# Patient Record
Sex: Female | Born: 1975 | Race: White | Hispanic: No | Marital: Married | Smoking: Never smoker
Health system: Southern US, Community
[De-identification: ages and names within clinical notes are randomized; demographics above are authoritative.]

## PROBLEM LIST (undated history)

## (undated) DIAGNOSIS — M1990 Unspecified osteoarthritis, unspecified site: Secondary | ICD-10-CM

## (undated) DIAGNOSIS — R51 Headache: Secondary | ICD-10-CM

## (undated) HISTORY — DX: Headache: R51

## (undated) HISTORY — DX: Unspecified osteoarthritis, unspecified site: M19.90

---

## 2017-05-12 DIAGNOSIS — R519 Headache, unspecified: Secondary | ICD-10-CM

## 2017-05-12 HISTORY — DX: Headache, unspecified: R51.9

## 2017-10-22 ENCOUNTER — Telehealth: Payer: Self-pay | Admitting: Obstetrics & Gynecology

## 2017-10-22 NOTE — Telephone Encounter (Signed)
[facility]  For [facility] OB [facility]. Called and left voicemail for patient to call back to be schedule

## 2017-10-23 NOTE — Telephone Encounter (Signed)
Patient is schedule 11/03/17

## 2017-10-29 ENCOUNTER — Encounter: Payer: Self-pay | Admitting: Advanced Practice Midwife

## 2017-11-03 ENCOUNTER — Encounter: Payer: Self-pay | Admitting: Advanced Practice Midwife

## 2017-11-03 ENCOUNTER — Ambulatory Visit (INDEPENDENT_AMBULATORY_CARE_PROVIDER_SITE_OTHER): Payer: BLUE CROSS/BLUE SHIELD | Admitting: Advanced Practice Midwife

## 2017-11-03 VITALS — BP 100/60 | Ht 66.0 in | Wt 157.0 lb

## 2017-11-03 DIAGNOSIS — Z130 Encounter for screening for diseases of the blood and blood-forming organs and certain disorders involving the immune mechanism: Secondary | ICD-10-CM

## 2017-11-03 DIAGNOSIS — Z131 Encounter for screening for diabetes mellitus: Secondary | ICD-10-CM

## 2017-11-03 DIAGNOSIS — O09522 Supervision of elderly multigravida, second trimester: Secondary | ICD-10-CM

## 2017-11-03 DIAGNOSIS — O9989 Other specified diseases and conditions complicating pregnancy, childbirth and the puerperium: Secondary | ICD-10-CM

## 2017-11-03 DIAGNOSIS — Z98891 History of uterine scar from previous surgery: Secondary | ICD-10-CM

## 2017-11-03 DIAGNOSIS — O09292 Supervision of pregnancy with other poor reproductive or obstetric history, second trimester: Secondary | ICD-10-CM

## 2017-11-03 DIAGNOSIS — Z113 Encounter for screening for infections with a predominantly sexual mode of transmission: Secondary | ICD-10-CM

## 2017-11-03 DIAGNOSIS — O34219 Maternal care for unspecified type scar from previous cesarean delivery: Secondary | ICD-10-CM

## 2017-11-03 DIAGNOSIS — Z3A25 25 weeks gestation of pregnancy: Secondary | ICD-10-CM

## 2017-11-03 DIAGNOSIS — N644 Mastodynia: Secondary | ICD-10-CM

## 2017-11-03 DIAGNOSIS — R5383 Other fatigue: Secondary | ICD-10-CM

## 2017-11-03 DIAGNOSIS — O0990 Supervision of high risk pregnancy, unspecified, unspecified trimester: Secondary | ICD-10-CM

## 2017-11-03 NOTE — Progress Notes (Signed)
C/O was seeing a specialist d/t age.

## 2017-11-03 NOTE — Patient Instructions (Signed)
Vaginal Birth After Cesarean Delivery Vaginal birth after cesarean delivery (VBAC) is giving birth vaginally after previously delivering a baby by a cesarean. In the past, if a woman had a cesarean delivery, all births afterward would be done by cesarean delivery. This is no longer true. It can be safe for the mother to try a vaginal delivery after having a cesarean delivery. It is important to discuss VBAC with your health care provider early in the pregnancy so you can understand the risks, benefits, and options. It will give you time to decide what is best in your particular case. The final decision about whether to have a VBAC or repeat cesarean delivery should be between you and your health care provider. Any changes in your health or your baby's health during your pregnancy may make it necessary to change your initial decision about VBAC. Women who plan to have a VBAC should check with their health care provider to be sure that:  The previous cesarean delivery was done with a low transverse uterine cut (incision) (not a vertical classical incision).  The birth canal is big enough for the baby.  There were no other operations on the uterus.  An electronic fetal monitor (EFM) will be on at all times during labor.  An operating room will be available and ready in case an emergency cesarean delivery is needed.  A health care provider and surgical nursing staff will be available at all times during labor to be ready to do an emergency delivery cesarean if necessary.  An anesthesiologist will be present in case an emergency cesarean delivery is needed.  The nursery is prepared and has adequate personnel and necessary equipment available to care for the baby in case of an emergency cesarean delivery. Benefits of VBAC  Shorter stay in the hospital.  Avoidance of risks associated with cesarean delivery, such as: ? Surgical complications, such as opening of the incision or hernia in the  incision. ? Injury to other organs. ? Fever. This can occur if an infection develops after surgery. It can also occur as a reaction to the medicine given to make you numb during the surgery.  Less blood loss and need for blood transfusions.  Lower risk of blood clots and infection.  Shorter recovery.  Decreased risk for having to remove the uterus (hysterectomy).  Decreased risk for the placenta to completely or partially cover the opening of the uterus (placenta previa) with a future pregnancy.  Decrease risk in future labor and delivery. Risks of a VBAC  Tearing (rupture) of the uterus. This is occurs in less than 1% of VBACs. The risk of this happening is higher if: ? Steps are taken to begin the labor process (induce labor) or stimulate or strengthen contractions (augment labor). ? Medicine is used to soften (ripen) the cervix.  Having to remove the uterus (hysterectomy) if it ruptures. VBAC should not be done if:  The previous cesarean delivery was done with a vertical (classical) or T-shaped incision or you do not know what kind of incision was made.  You had a ruptured uterus.  You have had certain types of surgery on your uterus, such as removal of uterine fibroids. Ask your health care provider about other types of surgeries that prevent you from having a VBAC.  You have certain medical or childbirth (obstetrical) problems.  There are problems with the baby.  You have had two previous cesarean deliveries and no vaginal deliveries. Other facts to know about VBAC:  It   is safe to have an epidural anesthetic with VBAC.  It is safe to turn the baby from a breech position (attempt an external cephalic version).  It is safe to try a VBAC with twins.  VBAC may not be successful if your baby weights 8.8 lb (4 kg) or more. However, weight predictions are not always accurate and should not be used alone to decide if VBAC is right for you.  There is an increased failure rate  if the time between the cesarean delivery and VBAC is less than 19 months.  Your health care provider may advise against a VBAC if you have preeclampsia (high blood pressure, protein in the urine, and swelling of face and extremities).  VBAC is often successful if you previously gave birth vaginally.  VBAC is often successful when the labor starts spontaneously before the due date.  Delivering a baby through a VBAC is similar to having a normal spontaneous vaginal delivery. This information is not intended to replace advice given to you by your health care provider. Make sure you discuss any questions you have with your health care provider. Document Released: 10/19/2006 Document Revised: 10/04/2015 Document Reviewed: 11/25/2012 Elsevier Interactive Patient Education  2018 Elsevier Inc.  

## 2017-11-04 ENCOUNTER — Encounter: Payer: Self-pay | Admitting: Advanced Practice Midwife

## 2017-11-04 DIAGNOSIS — O0990 Supervision of high risk pregnancy, unspecified, unspecified trimester: Secondary | ICD-10-CM | POA: Insufficient documentation

## 2017-11-04 DIAGNOSIS — Z98891 History of uterine scar from previous surgery: Secondary | ICD-10-CM | POA: Insufficient documentation

## 2017-11-04 NOTE — Progress Notes (Signed)
New Obstetric Patient H&P    Chief Complaint: Transfer of care from [facility], [place], [place]   History of Present Illness: Patient is a 42 y.o. G28P1011 Race unavailable female, presents with amenorrhea and positive home pregnancy test. Patient's last menstrual period was 05/10/2017 (exact date). and based on her  LMP, her EDD is Estimated Date of Delivery: 02/14/18 and her EGA is [redacted]w[redacted]d. Cycles are 6. days, regular, and occur approximately every : 31 days. Her last pap smear was 9 months ago and was no abnormalities.    She had a urine pregnancy test which was positive 5 month(s)  ago. Her last menstrual period was normal and lasted for  6 day(s). Since her LMP she claims she has experienced breast tenderness, fatigue, nausea. She denies vaginal bleeding. Her past medical history is noncontributory. Her prior pregnancies are notable for SAB, Primary LTCS for breech in 2015. She is AMA and will be 42 years old at time of delivery. She is undecided about repeat c-section vs VBAC.  Since her LMP, she admits to the use of tobacco products  no She claims she has gained   12 pounds since the start of her pregnancy.  There are cats in the home in the home  no  She admits close contact with children on a regular basis  yes  She has had chicken pox in the past no She has had Tuberculosis exposures, symptoms, or previously tested positive for TB   no Current or past history of domestic violence. no  Genetic Screening/Teratology Counseling: (Includes patient, baby's father, or anyone in either family with:)   1. Patient's age >/= 47 at [facility]  yes 2. Thalassemia ([place], [place], Mediterranean, or Asian background): MCV<80  no 3. Neural tube defect (meningomyelocele, spina bifida, anencephaly)  no 4. Congenital heart defect  no  5. Down syndrome  no 6. Tay-Sachs (Jewish, [place]  no 7. Canavan's Disease  no 8. Sickle cell disease or trait (African)  no  9. Hemophilia or other blood  disorders  no  10. Muscular dystrophy  no  11. Cystic fibrosis  no  12. Huntington's Chorea  no  13. Mental retardation/autism  no 14. Other inherited genetic or chromosomal disorder  no 15. Maternal metabolic disorder (DM, PKU, etc)  no 16. Patient or FOB with a child with a birth defect not listed above no  16a. Patient or FOB with a birth defect themselves no 17. Recurrent pregnancy loss, or stillbirth  no  18. Any medications since LMP other than prenatal vitamins (include vitamins, supplements, OTC meds, drugs, alcohol)  no 19. Any other genetic/environmental exposure to discuss  no  Infection History:   1. Lives with someone with TB or TB exposed  no  2. Patient or partner has history of genital herpes  no 3. Rash or viral illness since LMP  no 4. History of STI (GC, CT, HPV, syphilis, HIV)  no 5. History of recent travel :  no  Other pertinent information:  no     Review of Systems:10 point review of systems negative unless otherwise noted in HPI  Past Medical History:  Past Medical History:  Diagnosis Date  . Frequent headaches 2019   DURING PREG    Past Surgical History:  Past Surgical History:  Procedure Laterality Date  . CESAREAN SECTION  01/31/2014   BREECH    Gynecologic History: Patient's last menstrual period was 05/10/2017 (exact date).  Obstetric History: [id number]  Family History:  Family History  Problem Relation Age of Onset  . Diabetes Father   . Heart Problems Father   . Hypertension Father   . Heart Problems Paternal Grandmother   . Hypertension Paternal Grandmother   . Heart Problems Paternal Grandfather   . Hypertension Paternal Grandfather   . Heart Problems Paternal Uncle   . Heart Problems Paternal Uncle   . Heart Problems Paternal Uncle     Social History:  Social History   Socioeconomic History  . Marital status: Married    Spouse name: Not on file  . Number of children: 1  . Years of education: 45  . Highest education  level: Not on file  Occupational History  . Occupation: UNEMPLOYED  Social Needs  . Financial resource strain: Not on file  . Food insecurity:    Worry: Not on file    Inability: Not on file  . Transportation needs:    Medical: Not on file    Non-medical: Not on file  Tobacco Use  . Smoking status: Never Smoker  . Smokeless tobacco: Never Used  Substance and Sexual Activity  . Alcohol use: Not Currently  . Drug use: Never  . Sexual activity: Yes    Birth control/protection: None  Lifestyle  . Physical activity:    Days per week: Not on file    Minutes per session: Not on file  . Stress: Not on file  Relationships  . Social connections:    Talks on phone: Not on file    Gets together: Not on file    Attends religious service: Not on file    Active member of club or organization: Not on file    Attends meetings of clubs or organizations: Not on file    Relationship status: Not on file  . Intimate partner violence:    Fear of current or ex partner: Not on file    Emotionally abused: Not on file    Physically abused: Not on file    Forced sexual activity: Not on file  Other Topics Concern  . Not on file  Social History Narrative  . Not on file    Allergies:  No Known Allergies  Medications: Prior to Admission medications   Medication Sig Start Date End Date Taking? Authorizing Provider  Magnesium 500 MG TABS Take 1 tablet by mouth daily.   Yes [provider]  Prenatal Vit-Fe Fumarate-FA (MULTIVITAMIN-PRENATAL) 27-0.8 MG TABS tablet Take 1 tablet by mouth daily at 12 noon.   Yes [provider]    Physical Exam Vitals: Blood pressure 100/60, height 5\' 6"  (1.676 m), weight 157 lb (71.2 kg), last menstrual period 05/10/2017.  General: NAD HEENT: normocephalic, anicteric Thyroid: no enlargement, no palpable nodules Pulmonary: No increased work of breathing, CTAB Cardiovascular: RRR, distal pulses 2+ Abdomen: NABS, soft, non-tender,  non-distended.  Umbilicus without lesions.  No hepatomegaly, splenomegaly or masses palpable. No evidence of hernia  Genitourinary: deferred for recent PAP/no concerns Extremities: no edema, erythema, or tenderness Neurologic: Grossly intact Psychiatric: mood appropriate, affect full   Assessment: 42 y.o. G3P1011 at [redacted]w[redacted]d presenting for transfer of care  Plan: 1) Avoid alcoholic beverages. 2) Patient encouraged not to smoke.  3) Discontinue the use of all non-medicinal drugs and chemicals.  4) Take prenatal vitamins daily.  5) Nutrition, food safety (fish, cheese advisories, and high nitrite foods) and exercise discussed. 6) Hospital and practice style discussed with cross coverage system.  7) Genetic Screening, such as with 1st Trimester Screening, cell  free fetal DNA, AFP testing, and Ultrasound, as well as with amniocentesis and CVS as appropriate, was discussed with patient earlier in the pregnancy. Patient declined genetic testing 8) Patient is asked about travel to areas at risk for the [place] virus, and counseled to avoid travel and exposure to mosquitoes or sexual partners who may have themselves been exposed to the virus. Testing is discussed, and will be ordered as appropriate.  9) ROB in 3 weeks for 28 week, 1 hour gtt and Rhogam injection   Tresea MallJane Canton Yearby, CNM [facility], [facility] 11/04/2017, 2:16 PM

## 2017-11-24 ENCOUNTER — Ambulatory Visit (INDEPENDENT_AMBULATORY_CARE_PROVIDER_SITE_OTHER): Payer: BLUE CROSS/BLUE SHIELD | Admitting: Obstetrics and Gynecology

## 2017-11-24 ENCOUNTER — Other Ambulatory Visit: Payer: BLUE CROSS/BLUE SHIELD

## 2017-11-24 VITALS — BP 104/58 | Wt 163.0 lb

## 2017-11-24 DIAGNOSIS — Z98891 History of uterine scar from previous surgery: Secondary | ICD-10-CM

## 2017-11-24 DIAGNOSIS — Z130 Encounter for screening for diseases of the blood and blood-forming organs and certain disorders involving the immune mechanism: Secondary | ICD-10-CM

## 2017-11-24 DIAGNOSIS — O0990 Supervision of high risk pregnancy, unspecified, unspecified trimester: Secondary | ICD-10-CM

## 2017-11-24 DIAGNOSIS — O26899 Other specified pregnancy related conditions, unspecified trimester: Secondary | ICD-10-CM

## 2017-11-24 DIAGNOSIS — Z6791 Unspecified blood type, Rh negative: Secondary | ICD-10-CM

## 2017-11-24 DIAGNOSIS — O09523 Supervision of elderly multigravida, third trimester: Secondary | ICD-10-CM | POA: Insufficient documentation

## 2017-11-24 DIAGNOSIS — Z3A28 28 weeks gestation of pregnancy: Secondary | ICD-10-CM

## 2017-11-24 DIAGNOSIS — Z131 Encounter for screening for diabetes mellitus: Secondary | ICD-10-CM

## 2017-11-24 DIAGNOSIS — Z113 Encounter for screening for infections with a predominantly sexual mode of transmission: Secondary | ICD-10-CM

## 2017-11-24 MED ORDER — RHO D IMMUNE GLOBULIN 1500 UNIT/2ML IJ SOSY
300.0000 ug | PREFILLED_SYRINGE | Freq: Once | INTRAMUSCULAR | Status: AC
  Administered 2017-11-24: 300 ug via INTRAMUSCULAR

## 2017-11-24 NOTE — Progress Notes (Signed)
Routine Prenatal Care Visit  Subjective  Erin Rangel is a 42 y.o. G3P1011 at 6776w2d being seen today for ongoing prenatal care.  She is currently monitored for the following issues for this high-risk pregnancy and has Supervision of high risk pregnancy, antepartum; History of cesarean section; and AMA (advanced maternal age) multigravida 35+, third trimester on their problem list.  ----------------------------------------------------------------------------------- Patient reports no complaints.   Contractions: Not present. Vag. Bleeding: None.  Movement: Present. Denies leaking of fluid.  ----------------------------------------------------------------------------------- The following portions of the patient's history were reviewed and updated as appropriate: allergies, current medications, past family history, past medical history, past social history, past surgical history and problem list. Problem list updated.   Objective  Blood pressure (!) 104/58, weight 163 lb (73.9 kg), last menstrual period 05/10/2017. Pregravid weight 145 lb (65.8 kg) Total Weight Gain 18 lb (8.165 kg)  Body mass index is 26.31 kg/m.  Urinalysis: Urine Protein: Negative Urine Glucose: Negative  Fetal Status: Fetal Heart Rate (bpm): 150 Fundal Height: 27 cm Movement: Present     General:  Alert, oriented and cooperative. Patient is in no acute distress.  Skin: Skin is warm and dry. No rash noted.   Cardiovascular: Normal heart rate noted  Respiratory: Normal respiratory effort, no problems with respiration noted  Abdomen: Soft, gravid, appropriate for gestational age. Pain/Pressure: Absent     Pelvic:  Cervical exam deferred        Extremities: Normal range of motion.     ental Status: Normal mood and affect. Normal behavior. Normal judgment and thought content.     Assessment   42 y.o. G3P1011 at 5776w2d by  02/14/2018, by Last Menstrual Period presenting for routine prenatal visit  Plan   SECOND  Problems (from 11/03/17 to present)    Problem Noted Resolved   History of cesarean section 11/04/2017 by Tresea MallGledhill, Jane, CNM No       Gestational age appropriate obstetric precautions including but not limited to vaginal bleeding, contractions, leaking of fluid and fetal movement were reviewed in detail with the patient.   - rhogam and 28 week labs today  42 y.o. G3P1011 at 4776w2d with Estimated Date of Delivery: 02/14/18 was seen today in office to discuss trial of labor after cesarean section (TOLAC) versus elective repeat cesarean delivery (ERCD). The following risks were discussed with the patient.  Risk of uterine rupture at term is 0.78 percent with TOLAC and 0.22 percent with ERCD. 1 in 10 uterine ruptures will result in neonatal death or neurological injury. The benefits of a trial of labor after cesarean (TOLAC) resulting in a vaginal birth after cesarean (VBAC) include the following: shorter length of hospital stay and postpartum recovery (in most cases); fewer complications, such as postpartum fever, wound or uterine infection, thromboembolism (blood clots in the leg or lung), need for blood transfusion and fewer neonatal breathing problems. The risks of an attempted VBAC or TOLAC include the following: Risk of failed trial of labor after cesarean (TOLAC) without a vaginal birth after cesarean (VBAC) resulting in repeat cesarean delivery (RCD) in about 20 to 40 percent of women who attempt VBAC.  Her individualized success rate using the MFMU VBAC risk calculator is 65.4%.   Risk of rupture of uterus resulting in an emergency cesarean delivery. The risk of uterine rupture may be related in part to the type of uterine incision made during the first cesarean delivery. A previous transverse uterine incision has the lowest risk of rupture (0.2 to 1.5 percent  risk). Vertical or T-shaped uterine incisions have a higher risk of uterine rupture (4 to 9 percent risk)The risk of fetal death is very low  with both VBAC and elective repeat cesarean delivery (ERCD), but the likelihood of fetal death is higher with VBAC than with ERCD. Maternal death is very rare with either type of delivery. The risks of an elective repeat cesarean delivery (ERCD) were reviewed with the patient including but not limited to: 06/998 risk of uterine rupture which could have serious consequences, bleeding which may require transfusion; infection which may require antibiotics; injury to bowel, bladder or other surrounding organs (bowel, bladder, ureters); injury to the fetus; need for additional procedures including hysterectomy in the event of a life-threatening hemorrhage; thromboembolic phenomenon; abnormal placentation; incisional problems; death and other postoperative or anesthesia complications.    In addition we discussed that our collective office practice is to allow patient's who desire to attempt TOLAC to go into labor naturally.  There is some limited data that rupture rate may increase past [redacted] weeks gestation, but it is reasonable for women who are strongly committed to [facility] to continue pregnancy into the 41st week.  Medical indications necessetating early delivery may arise during the course of any pregnancy.  Given the contraindication on the use of prostaglandins for use in cervical ripening,  recommendation would be to proceed with repeat cesarean for delivery for patient's with unfavorable cervix (low Bishops score) who reach 41 weeks or who otherwise have a medical indication for early delivery.   These risks and benefits are summarized on the consent form, which was reviewed with the patient during the visit.  All her questions answered and she signed a consent indicating a preference for TOLAC/ERCD. A copy of the consent was given to the patient.  MFMU VBAC calculator success rate of 65.4 with 95% CI of 62.3% to 68.4%.  The patient was counseled regarding recommendation for elective repeat cesarean section  (ERCS) given that she has failed to go into spontaneous labor by [redacted]w[redacted]d gestation and has an unfavorable cervix.  Previous studies examining have noted an increased risk or maternal and fetal morbidity for patient attempting a trial of labor whose success rated is <70% compared to [facility], while morbidity was similar for patient attempting a trial of labor vs ERCS with success rates >70. ("Can a prediction model for vaginal birth after cesarean section also predict the probablity of  Morbidity related to a trial of labor?" American Jourcal of Obstetric and Gynecology 2009  January)  Vena Austria, MD, [facility] - [facility]   Return in about 2 weeks (around 12/08/2017) for ROB-Naria Abbey.  Vena Austria, MD, Evern Core [facility], [facility] 11/24/2017, 10:00 AM

## 2017-11-24 NOTE — Progress Notes (Signed)
ROB 28 week labs Rhogam

## 2017-11-24 NOTE — Patient Instructions (Signed)
Risk of uterine rupture at term is 0.78 percent with TOLAC and 0.22 percent with ERCD. 1 in 10 uterine ruptures will result in neonatal death or neurological injury. The benefits of a trial of labor after cesarean (TOLAC) resulting in a vaginal birth after cesarean (VBAC) include the following: shorter length of hospital stay and postpartum recovery (in most cases); fewer complications, such as postpartum fever, wound or uterine infection, thromboembolism (blood clots in the leg or lung), need for blood transfusion and fewer neonatal breathing problems. The risks of an attempted VBAC or TOLAC include the following: Risk of failed trial of labor after cesarean (TOLAC) without a vaginal birth after cesarean (VBAC) resulting in repeat cesarean delivery (RCD) in about 20 to 40 percent of women who attempt VBAC.  Her individualized success rate using the MFMU VBAC risk calculator is 65.4%.   Risk of rupture of uterus resulting in an emergency cesarean delivery. The risk of uterine rupture may be related in part to the type of uterine incision made during the first cesarean delivery. A previous transverse uterine incision has the lowest risk of rupture (0.2 to 1.5 percent risk). Vertical or T-shaped uterine incisions have a higher risk of uterine rupture (4 to 9 percent risk)The risk of fetal death is very low with both VBAC and elective repeat cesarean delivery (ERCD), but the likelihood of fetal death is higher with VBAC than with ERCD. Maternal death is very rare with either type of delivery. The risks of an elective repeat cesarean delivery (ERCD) were reviewed with the patient including but not limited to: 06/998 risk of uterine rupture which could have serious consequences, bleeding which may require transfusion; infection which may require antibiotics; injury to bowel, bladder or other surrounding organs (bowel, bladder, ureters); injury to the fetus; need for additional procedures including hysterectomy in  the event of a life-threatening hemorrhage; thromboembolic phenomenon; abnormal placentation; incisional problems; death and other postoperative or anesthesia complications.    In addition we discussed that our collective office practice is to allow patient's who desire to attempt TOLAC to go into labor naturally.  There is some limited data that rupture rate may increase past [redacted] weeks gestation, but it is reasonable for women who are strongly [facility] to continue pregnancy into the 41st week.  Medical indications necessetating early delivery may arise during the course of any pregnancy.  Given the contraindication on the use of prostaglandins for use in cervical ripening,  recommendation would be to proceed with repeat cesarean for delivery for patient's with unfavorable cervix (low Bishops score) who reach 41 weeks or who otherwise have a medical indication for early delivery.   These risks and benefits are summarized on the consent form, which was reviewed with the patient during the visit.  All her questions answered and she signed a consent indicating a preference for TOLAC/ERCD. A copy of the consent was given to the patient.  MFMU VBAC calculator success rate of 65.4 with 95% CI of 62.3% to 68.4%.  The patient was counseled regarding recommendation for elective repeat cesarean section (ERCS) given that she has failed to go into spontaneous labor by 5246w3d gestation and has an unfavorable cervix.  Previous studies examining have noted an increased risk or maternal and fetal morbidity for patient attempting a trial of labor whose success rated is <70% compared to [facility], while morbidity was similar for patient attempting a trial of labor vs ERCS with success rates >70. ("Can a prediction model for vaginal birth after  cesarean section also predict the probablity of  Morbidity related to a trial of labor?" American Jourcal of Obstetric and Gynecology 2009  January)

## 2017-11-25 LAB — 28 WEEKS RH-PANEL
Antibody Screen: NEGATIVE
BASOS: 0 %
Basophils Absolute: 0 10*3/uL (ref 0.0–0.2)
EOS (ABSOLUTE): 0.1 10*3/uL (ref 0.0–0.4)
EOS: 1 %
Gestational Diabetes Screen: 112 mg/dL (ref 65–139)
HIV SCREEN 4TH GENERATION: NONREACTIVE
Hematocrit: 32.3 % — ABNORMAL LOW (ref 34.0–46.6)
Hemoglobin: 11.1 g/dL (ref 11.1–15.9)
IMMATURE GRANULOCYTES: 0 %
Immature Grans (Abs): 0 10*3/uL (ref 0.0–0.1)
LYMPHS ABS: 1.1 10*3/uL (ref 0.7–3.1)
Lymphs: 14 %
MCH: 32.8 pg (ref 26.6–33.0)
MCHC: 34.4 g/dL (ref 31.5–35.7)
MCV: 96 fL (ref 79–97)
Monocytes Absolute: 0.5 10*3/uL (ref 0.1–0.9)
Monocytes: 6 %
NEUTROS PCT: 79 %
Neutrophils Absolute: 5.9 10*3/uL (ref 1.4–7.0)
Platelets: 253 10*3/uL (ref 150–450)
RBC: 3.38 x10E6/uL — ABNORMAL LOW (ref 3.77–5.28)
RDW: 12.9 % (ref 12.3–15.4)
RPR Ser Ql: NONREACTIVE
WBC: 7.5 10*3/uL (ref 3.4–10.8)

## 2017-12-10 ENCOUNTER — Telehealth: Payer: Self-pay | Admitting: Obstetrics and Gynecology

## 2017-12-10 ENCOUNTER — Ambulatory Visit (INDEPENDENT_AMBULATORY_CARE_PROVIDER_SITE_OTHER): Payer: BLUE CROSS/BLUE SHIELD | Admitting: Obstetrics and Gynecology

## 2017-12-10 VITALS — BP 110/70 | Wt 161.0 lb

## 2017-12-10 DIAGNOSIS — O34219 Maternal care for unspecified type scar from previous cesarean delivery: Secondary | ICD-10-CM

## 2017-12-10 DIAGNOSIS — Z3A30 30 weeks gestation of pregnancy: Secondary | ICD-10-CM | POA: Diagnosis not present

## 2017-12-10 DIAGNOSIS — Z98891 History of uterine scar from previous surgery: Secondary | ICD-10-CM

## 2017-12-10 DIAGNOSIS — Z23 Encounter for immunization: Secondary | ICD-10-CM | POA: Diagnosis not present

## 2017-12-10 DIAGNOSIS — O0990 Supervision of high risk pregnancy, unspecified, unspecified trimester: Secondary | ICD-10-CM

## 2017-12-10 DIAGNOSIS — O09523 Supervision of elderly multigravida, third trimester: Secondary | ICD-10-CM

## 2017-12-10 NOTE — Telephone Encounter (Signed)
-----   Message from Vena AustriaAndreas Staebler, MD sent at 12/10/2017  8:56 AM EDT ----- Regarding: C-section & BTL Surgery Date:   LOS: surgery admit  Surgery Booking Request Patient Full Name: Erin Rangel MRN: [id number]  DOB: Jul 28, 1975  Surgeon: Vena AustriaAndreas Staebler, MD  Requested Surgery Date and Time: 12/10/17 0745 Primary Diagnosis and Code: History prior C-section Secondary Diagnosis and Code:  Surgical Procedure: Cesarean section with tubal ligation L&D Notification:yes Admission Status: same day surgery Length of Surgery: 1h Special Case Needs: none H&P: day prior (date) Phone Interview or Office Pre-Admit: pre-admit Interpreter: No Language: English Medical Clearance: No Special Scheduling Instructions: none

## 2017-12-10 NOTE — Telephone Encounter (Signed)
Lmtrc

## 2017-12-10 NOTE — Progress Notes (Signed)
    Routine Prenatal Care Visit  Subjective  Erin Rangel is a 42 y.o. G3P1011 at 9058w4d being seen today for ongoing prenatal care.  She is currently monitored for the following issues for this high-risk pregnancy and has Supervision of high risk pregnancy, antepartum; History of cesarean section; and AMA (advanced maternal age) multigravida 35+, third trimester on their problem list.  ----------------------------------------------------------------------------------- Patient reports no complaints.   Contractions: Not present. Vag. Bleeding: None.  Movement: Present. Denies leaking of fluid.  ----------------------------------------------------------------------------------- The following portions of the patient's history were reviewed and updated as appropriate: allergies, current medications, past family history, past medical history, past social history, past surgical history and problem list. Problem list updated.   Objective  Blood pressure 110/70, weight 161 lb (73 kg), last menstrual period 05/10/2017. Pregravid weight 145 lb (65.8 kg) Total Weight Gain 16 lb (7.258 kg) Urinalysis: Urine Protein: Negative Urine Glucose: Negative  Fetal Status: Fetal Heart Rate (bpm): 155 Fundal Height: 30 cm Movement: Present     General:  Alert, oriented and cooperative. Patient is in no acute distress.  Skin: Skin is warm and dry. No rash noted.   Cardiovascular: Normal heart rate noted  Respiratory: Normal respiratory effort, no problems with respiration noted  Abdomen: Soft, gravid, appropriate for gestational age. Pain/Pressure: Absent     Pelvic:  Cervical exam deferred        Extremities: Normal range of motion.     ental Status: Normal mood and affect. Normal behavior. Normal judgment and thought content.     Assessment   42 y.o. G3P1011 at 5758w4d by  02/14/2018, by Last Menstrual Period presenting for routine prenatal visit  Plan   SECOND Problems (from 11/03/17 to present)    Problem Noted Resolved   AMA (advanced maternal age) multigravida 35+, third trimester 11/24/2017 by Vena AustriaStaebler, Keondrick Dilks, MD No   Overview Signed 11/24/2017 10:00 AM by Vena AustriaStaebler, Jennalee Greaves, MD    [ ]  Growth scan 36 weeks [ ]  APT starting at 36 weeks [ ]  Deliver by 40 weeks      Supervision of high risk pregnancy, antepartum 11/04/2017 by Tresea MallGledhill, Jane, CNM No   Overview Addendum 12/10/2017  8:47 AM by Vena AustriaStaebler, Megann Easterwood, MD    Clinic [facility] Prenatal Labs  Dating LMP=8w Blood type: O negative  Genetic Screen Declined Antibody: negative  Anatomic US Normal Rubella: Immune Varicella: Immune  GTT 112 RPR: Non-Reactive  Rhogam 28 wk [X] , PP PRN HBsAg: Negative  TDaP vaccine 12/10/17 Flu Shot: HIV: Negative  Baby Food Breast                               GBS:   Contraception BTL Pap: September 2018/no abnormalities  CBB     CS/VBAC Primary 2015/breech desires repeat   Support Person Husband RJ           History of cesarean section 11/04/2017 by Tresea MallGledhill, Jane, CNM No       Gestational age appropriate obstetric precautions including but not limited to vaginal bleeding, contractions, leaking of fluid and fetal movement were reviewed in detail with the patient.  - TDAP today - repeat C-section and BTL scheduled 10/1   Return in about 2 weeks (around 12/24/2017).  Vena AustriaAndreas Thurmon Mizell, MD, Evern CoreFACOG [facility], [facility] 12/10/2017, 8:51 AM

## 2017-12-10 NOTE — Progress Notes (Signed)
ROB TDAP  

## 2017-12-10 NOTE — Telephone Encounter (Signed)
[facility] @ 9:10am w/ Dr. Bonney AidStaebler, Pre-admit Testing to be scheduled for 02/08/18 (9 or 9:15am requested), and OR on 02/09/18.

## 2017-12-24 ENCOUNTER — Ambulatory Visit (INDEPENDENT_AMBULATORY_CARE_PROVIDER_SITE_OTHER): Payer: BLUE CROSS/BLUE SHIELD | Admitting: Obstetrics & Gynecology

## 2017-12-24 VITALS — BP 100/60 | Wt 160.0 lb

## 2017-12-24 DIAGNOSIS — O0990 Supervision of high risk pregnancy, unspecified, unspecified trimester: Secondary | ICD-10-CM

## 2017-12-24 DIAGNOSIS — Z3A32 32 weeks gestation of pregnancy: Secondary | ICD-10-CM

## 2017-12-24 DIAGNOSIS — Z23 Encounter for immunization: Secondary | ICD-10-CM

## 2017-12-24 DIAGNOSIS — O09523 Supervision of elderly multigravida, third trimester: Secondary | ICD-10-CM

## 2017-12-24 DIAGNOSIS — O34219 Maternal care for unspecified type scar from previous cesarean delivery: Secondary | ICD-10-CM

## 2017-12-24 DIAGNOSIS — Z98891 History of uterine scar from previous surgery: Secondary | ICD-10-CM

## 2017-12-24 LAB — POCT URINALYSIS DIPSTICK OB
Glucose, UA: NEGATIVE — AB
PROTEIN: NEGATIVE

## 2017-12-24 NOTE — Patient Instructions (Signed)

## 2017-12-24 NOTE — Progress Notes (Signed)
  Subjective  Fetal Movement? yes Contractions? no Leaking Fluid? no Vaginal Bleeding? no  Objective  BP 100/60   Wt 160 lb (72.6 kg)   LMP 05/10/2017 (Exact Date)   BMI 25.82 kg/m  General: NAD Pumonary: no increased work of breathing Abdomen: gravid, non-tender Extremities: no edema Psychiatric: mood appropriate, affect full  Assessment  42 y.o. G3P1011 at 171w4d by  02/14/2018, by Last Menstrual Period presenting for routine prenatal visit  Plan   Problem List Items Addressed This Visit      Other   Supervision of high risk pregnancy, antepartum   History of cesarean section   AMA (advanced maternal age) multigravida 35+, third trimester    Other Visit Diagnoses    [redacted] weeks gestation of pregnancy    -  Primary    APT 36 weeks CS vs VBAC discussed again today; likely desire for repeat CS Oct 1; can consider changing date to >40 weeks if desires VBAC. Flu shot today  Annamarie MajorPaul Brettney Ficken, MD, Merlinda FrederickFACOG [facility], [facility] 12/24/2017  9:35 AM

## 2017-12-24 NOTE — Addendum Note (Signed)
Addended by: Cornelius MorasPATTERSON, Magdalina Whitehead D on: 12/24/2017 10:01 AM   Modules accepted: Orders

## 2018-01-07 ENCOUNTER — Ambulatory Visit (INDEPENDENT_AMBULATORY_CARE_PROVIDER_SITE_OTHER): Payer: BLUE CROSS/BLUE SHIELD | Admitting: Obstetrics and Gynecology

## 2018-01-07 VITALS — BP 126/70 | Wt 164.0 lb

## 2018-01-07 DIAGNOSIS — O09523 Supervision of elderly multigravida, third trimester: Secondary | ICD-10-CM

## 2018-01-07 DIAGNOSIS — Z98891 History of uterine scar from previous surgery: Secondary | ICD-10-CM

## 2018-01-07 DIAGNOSIS — Z3A34 34 weeks gestation of pregnancy: Secondary | ICD-10-CM

## 2018-01-07 DIAGNOSIS — O34219 Maternal care for unspecified type scar from previous cesarean delivery: Secondary | ICD-10-CM

## 2018-01-07 DIAGNOSIS — O0990 Supervision of high risk pregnancy, unspecified, unspecified trimester: Secondary | ICD-10-CM

## 2018-01-07 LAB — POCT URINALYSIS DIPSTICK OB
Glucose, UA: NEGATIVE
PROTEIN: NEGATIVE

## 2018-01-07 NOTE — Progress Notes (Signed)
    Routine Prenatal Care Visit  Subjective  Erin Rangel is a 42 y.o. G3P1011 at 2743w4d being seen today for ongoing prenatal care.  She is currently monitored for the following issues for this high-risk pregnancy and has Supervision of high risk pregnancy, antepartum; History of cesarean section; and AMA (advanced maternal age) multigravida 35+, third trimester on their problem list.  ----------------------------------------------------------------------------------- Patient reports no complaints.   Contractions: Not present. Vag. Bleeding: None.  Movement: Present. Denies leaking of fluid.  ----------------------------------------------------------------------------------- The following portions of the patient's history were reviewed and updated as appropriate: allergies, current medications, past family history, past medical history, past social history, past surgical history and problem list. Problem list updated.   Objective  Blood pressure 126/70, weight 164 lb (74.4 kg), last menstrual period 05/10/2017. Pregravid weight 145 lb (65.8 kg) Total Weight Gain 19 lb (8.618 kg) Urinalysis:      Fetal Status: Fetal Heart Rate (bpm): 150 Fundal Height: 33 cm Movement: Present     General:  Alert, oriented and cooperative. Patient is in no acute distress.  Skin: Skin is warm and dry. No rash noted.   Cardiovascular: Normal heart rate noted  Respiratory: Normal respiratory effort, no problems with respiration noted  Abdomen: Soft, gravid, appropriate for gestational age. Pain/Pressure: Present     Pelvic:  Cervical exam deferred        Extremities: Normal range of motion.     ental Status: Normal mood and affect. Normal behavior. Normal judgment and thought content.     Assessment   42 y.o. G3P1011 at 4843w4d by  02/14/2018, by Last Menstrual Period presenting for routine prenatal visit  Plan   SECOND Problems (from 11/03/17 to present)    Problem Noted Resolved   AMA (advanced  maternal age) multigravida 35+, third trimester 11/24/2017 by Vena AustriaStaebler, Jolyn Deshmukh, MD No   Overview Signed 11/24/2017 10:00 AM by Vena AustriaStaebler, Halley Shepheard, MD    [ ]  Growth scan 36 weeks [ ]  APT starting at 36 weeks [ ]  Deliver by 40 weeks      Supervision of high risk pregnancy, antepartum 11/04/2017 by Tresea MallGledhill, Jane, CNM No   Overview Addendum 12/10/2017  8:47 AM by Vena AustriaStaebler, Moni Rothrock, MD    [facility]inic [facility] Prenatal Labs  Dating LMP=8w Blood type: O negative  Genetic Screen Declined Antibody: negative  Anatomic US Normal Rubella: Immune Varicella: Immune  GTT 112 RPR: Non-Reactive  Rhogam 28 wk [X] , PP PRN HBsAg: Negative  TDaP vaccine 12/10/17 Flu Shot: HIV: Negative  Baby Food Breast                               GBS:   Contraception BTL Pap: September 2018/no abnormalities  CBB     CS/VBAC Primary 2015/breech desires repeat   Support Person Husband RJ           History of cesarean section 11/04/2017 by Tresea MallGledhill, Jane, CNM No       Gestational age appropriate obstetric precautions including but not limited to vaginal bleeding, contractions, leaking of fluid and fetal movement were reviewed in detail with the patient.    - Growth scan and NST starting next visit - GBS next visit  Return in about 2 weeks (around 01/21/2018) for ROB, growth, and NST.  Vena AustriaAndreas Geraldin Habermehl, MD, Merlinda FrederickFACOG [facility], [facility] 01/07/2018, 12:39 PM

## 2018-01-07 NOTE — Progress Notes (Signed)
ROB Breast pump ordered

## 2018-01-21 ENCOUNTER — Ambulatory Visit (INDEPENDENT_AMBULATORY_CARE_PROVIDER_SITE_OTHER): Payer: BLUE CROSS/BLUE SHIELD | Admitting: Obstetrics and Gynecology

## 2018-01-21 ENCOUNTER — Ambulatory Visit (INDEPENDENT_AMBULATORY_CARE_PROVIDER_SITE_OTHER): Payer: BLUE CROSS/BLUE SHIELD

## 2018-01-21 VITALS — BP 112/72 | Wt 164.0 lb

## 2018-01-21 DIAGNOSIS — Z3685 Encounter for antenatal screening for Streptococcus B: Secondary | ICD-10-CM

## 2018-01-21 DIAGNOSIS — O09523 Supervision of elderly multigravida, third trimester: Secondary | ICD-10-CM

## 2018-01-21 DIAGNOSIS — Z3A37 37 weeks gestation of pregnancy: Secondary | ICD-10-CM | POA: Diagnosis not present

## 2018-01-21 DIAGNOSIS — Z98891 History of uterine scar from previous surgery: Secondary | ICD-10-CM

## 2018-01-21 DIAGNOSIS — O0990 Supervision of high risk pregnancy, unspecified, unspecified trimester: Secondary | ICD-10-CM

## 2018-01-21 DIAGNOSIS — Z3A36 36 weeks gestation of pregnancy: Secondary | ICD-10-CM | POA: Diagnosis not present

## 2018-01-21 DIAGNOSIS — O34219 Maternal care for unspecified type scar from previous cesarean delivery: Secondary | ICD-10-CM | POA: Diagnosis not present

## 2018-01-21 NOTE — Progress Notes (Signed)
Routine Prenatal Care Visit  Subjective  Erin Rangel is a 42 y.o. G3P1011 at 8744w4d being seen today for ongoing prenatal care.  She is currently monitored for the following issues for this high-risk pregnancy and has Supervision of high risk pregnancy, antepartum; History of cesarean section; and AMA (advanced maternal age) multigravida 35+, third trimester on their problem list.  ----------------------------------------------------------------------------------- Patient reports no complaints.   Contractions: Irregular. Vag. Bleeding: None.  Movement: Present. Denies leaking of fluid.  ----------------------------------------------------------------------------------- The following portions of the patient's history were reviewed and updated as appropriate: allergies, current medications, past family history, past medical history, past social history, past surgical history and problem list. Problem list updated.   Objective  Blood pressure 112/72, weight 164 lb (74.4 kg), last menstrual period 05/10/2017. Pregravid weight 145 lb (65.8 kg) Total Weight Gain 19 lb (8.618 kg) Urinalysis:      Fetal Status: Fetal Heart Rate (bpm): 140 Fundal Height: 35 cm Movement: Present     General:  Alert, oriented and cooperative. Patient is in no acute distress.  Skin: Skin is warm and dry. No rash noted.   Cardiovascular: Normal heart rate noted  Respiratory: Normal respiratory effort, no problems with respiration noted  Abdomen: Soft, gravid, appropriate for gestational age. Pain/Pressure: Present     Pelvic:  Cervical exam performed        Extremities: Normal range of motion.     ental Status: Normal mood and affect. Normal behavior. Normal judgment and thought content.  [place] Ob Follow Up  Result Date: 01/21/2018 Patient Name: Erin Rangel DOB: Oct 08, 1975 MRN: [id number] ULTRASOUND REPORT Location: [facility] Date of Service: 01/21/2018 Indications:growth/afi Findings: Mason JimSingleton intrauterine  pregnancy is visualized with FHR at 161 BPM. Biometrics give an (U/S) Gestational age of 6170w3d and an (U/S) EDD of 02/08/18; this correlates with the clinically established Estimated Date of Delivery: 02/14/18. Fetal presentation is Cephalic. Placenta: fundal, anterior Grade: 1 AFI: 10.95 cm Growth percentile is 66.5%. EFW: 7lb0oz/3185g (document both grams and pounds) Impression: 1. 8444w4d Viable Singleton Intrauterine pregnancy previously established criteria. 2. Growth is 66.5 %ile.  AFI is 10.95 cm. Recommendations: 1.Clinical correlation with the patient's History and Physical Exam. Abeer Alsammarraie,RDMS There is a singleton gestation with normal amniotic fluid volume. The fetal biometry correlates with established dating.  Limited fetal anatomy was performed.The visualized fetal anatomical survey appears within normal limits within the resolution of ultrasound as described above.  It must be noted that a normal ultrasound is unable to rule out fetal aneuploidy.  Vena AustriaAndreas Kaiser Belluomini, MD, FACOG [facility], [facility] 01/21/2018, 2:34 PM    Baseline: 140 Variability: moderate Accelerations: present Decelerations: absent Tocometry: N/A The patient was monitored for 30 minutes, fetal heart rate tracing was deemed reactive, category I tracing,    Assessment   42 y.o. G3P1011 at 1444w4d by  02/14/2018, by Last Menstrual Period presenting for routine prenatal visit  Plan   SECOND Problems (from 11/03/17 to present)    Problem Noted Resolved   AMA (advanced maternal age) multigravida 35+, third trimester 11/24/2017 by Vena AustriaStaebler, Shelonda Saxe, MD No   Overview Signed 11/24/2017 10:00 AM by Vena AustriaStaebler, Kayzlee Wirtanen, MD    [X]  Growth scan 36 weeks - 7lbs 5oz or 3185g c/w 66/5%ile [X]  APT starting at 36 weeks [X]  Deliver by 40 weeks - C-section 02/09/18      Supervision of high risk pregnancy, antepartum 11/04/2017 by Tresea MallGledhill, Jane, CNM No   Overview Addendum 12/10/2017  8:47 AM by Vena AustriaStaebler, Maleik Vanderzee,  MD    [facility]inic [facility] Prenatal Labs  Dating LMP=8w Blood type: O negative  Genetic Screen Declined Antibody: negative  Anatomic [place] Normal Rubella: Immune Varicella: Immune  GTT 112 RPR: Non-Reactive  Rhogam 28 wk [X] , PP PRN HBsAg: Negative  TDaP vaccine 12/10/17 Flu Shot: HIV: Negative  Baby Food Breast                               GBS:   Contraception BTL Pap: September 2018/no abnormalities  CBB     CS/VBAC Primary 2015/breech desires repeat   Support Person Husband RJ           History of cesarean section 11/04/2017 by Tresea Mall, CNM No       Gestational age appropriate obstetric precautions including but not limited to vaginal bleeding, contractions, leaking of fluid and fetal movement were reviewed in detail with the patient.    Return in about 2 weeks (around 02/04/2018) for ROB, NST/AFI.  Vena Austria, MD, Evern Core [facility], [facility] 01/21/2018, 2:52 PM

## 2018-01-21 NOTE — Progress Notes (Signed)
ROB Growth scan  NST GBS

## 2018-01-23 LAB — STREP GP B NAA: Strep Gp B NAA: NEGATIVE

## 2018-01-28 ENCOUNTER — Ambulatory Visit (INDEPENDENT_AMBULATORY_CARE_PROVIDER_SITE_OTHER): Payer: BLUE CROSS/BLUE SHIELD

## 2018-01-28 ENCOUNTER — Ambulatory Visit (INDEPENDENT_AMBULATORY_CARE_PROVIDER_SITE_OTHER): Payer: BLUE CROSS/BLUE SHIELD | Admitting: Obstetrics & Gynecology

## 2018-01-28 ENCOUNTER — Other Ambulatory Visit: Payer: Self-pay | Admitting: Obstetrics and Gynecology

## 2018-01-28 VITALS — BP 120/80 | Wt 164.0 lb

## 2018-01-28 DIAGNOSIS — Z3A37 37 weeks gestation of pregnancy: Secondary | ICD-10-CM

## 2018-01-28 DIAGNOSIS — O0990 Supervision of high risk pregnancy, unspecified, unspecified trimester: Secondary | ICD-10-CM

## 2018-01-28 DIAGNOSIS — O288 Other abnormal findings on antenatal screening of mother: Secondary | ICD-10-CM

## 2018-01-28 DIAGNOSIS — O09523 Supervision of elderly multigravida, third trimester: Secondary | ICD-10-CM | POA: Diagnosis not present

## 2018-01-28 LAB — POCT URINALYSIS DIPSTICK OB
GLUCOSE, UA: NEGATIVE
POC,PROTEIN,UA: NEGATIVE

## 2018-01-28 NOTE — Addendum Note (Signed)
Addended by: Cornelius MorasPATTERSON, Jatavion Peaster D on: 01/28/2018 09:47 AM   Modules accepted: Orders

## 2018-01-28 NOTE — Patient Instructions (Signed)
Monitoring for Oligohydramnios Oligohydramnios is a conditionin which there is not enough fluid in the sac (amniotic sac) that surrounds your unborn baby (fetus). The amniotic sac in the uterus contains fluid (amniotic fluid) that:  Protects your baby from injury (trauma) and infections.  Helps your baby move freely inside the uterus.  Helps your baby's lungs, kidneys, and digestive system to develop.  This condition occurs before birth (is a prenatal condition), and it can interfere with your baby's normal prenatal growth and development. Oligohydramnios can happen any time during pregnancy, and it most commonly occurs during the last 3 months (third trimester). It is most likely to cause serious complications when it occurs early in pregnancy. Some possible complications of this condition include:  Early (premature) birth.  Birth defects.  Limited (restricted) fetal growth.  Decreased oxygen flow to the fetus due to pressure on the umbilical cord.  Pregnancy loss (miscarriage).  Stillbirth.  What are the causes? This condition may be caused by:  A leak or tear in the amniotic sac.  A problem with the organ that nourishes the baby in the uterus (placenta), such as failure of the placenta to provide enough blood, fluid, and nutrients to the baby.  Having identical twins who share the same placenta.  A fetal birth defect. This is usually an abnormality in the fetal kidneys or urinary tract.  A pregnancy that goes past the due date.  A condition that the mother has, such as: ? A lack of fluids in the body (dehydration). ? High blood pressure. ? Diabetes. ? Reactions to certain medicines, such as ibuprofen or blood pressure medicines (ACE inhibitors). ? Systemic lupus.  In some cases, the cause is unknown. What increases the risk? This condition is more likely to develop if you:  Become dehydrated.  Have high blood pressure.  Take NSAIDs or ACE inhibitors.  Have  diabetes or lupus.  Have poor prenatal care.  What are the signs or symptoms? In most cases, there are no symptoms of oligohydramnios. If you do have symptoms, they may include:  Fluid leaking from the vagina.  Having a uterus that is smaller than normal.  Feeling less movement of your baby in your uterus.  How is this diagnosed? This condition may be diagnosed by measuring the amount of amniotic fluid in your amniotic sac using the amniotic fluid index (AFI). The AFI is a prenatal ultrasound test that uses painless, harmless sound waves to create an image of your uterus and your baby. An AFI prenatal ultrasound test may be done:  To measure the amniotic fluid level.  To check your baby's kidneys and your baby's growth.  To evaluate the placenta.  You may also have other tests to find the cause of oligohydramnios. How is this treated? Treatment for this condition depends on how low your amniotic fluid level is, how far along you are in your pregnancy, and your overall health. Treatment may include:  Having your health care provider monitor your condition more closely than usual. You may have more frequent appointments and more AFI ultrasound measurements.  Increasing the amount of fluid in your body. This may be done by having you drink more fluids, or by giving you fluids through an IV tube that is inserted into one of your veins.  Injecting fluid into your amniotic sac during delivery (amnioinfusion).  Having your baby delivered early, if you are close to your due date.  Follow these instructions at home: Lifestyle  Do not drink alcohol.  No safe level of alcohol consumption during pregnancy has been determined.  Do not use any tobacco products, such as cigarettes, chewing tobacco, and e-cigarettes. If you need help quitting, ask your health care provider.  Do not use any illegal drugs. These can harm your developing baby or cause a miscarriage. General instructions  Take  over-the-counter and prescription medicines only as told by your health care provider.  Follow instructions from your health care provider about physical activity and rest. Your health care provider may recommend that you stay in bed (be on bed rest).  Follow instructions from your health care provider about eating or drinking restrictions.  Eat healthy foods.  Drink enough fluids to keep your urine clear or pale yellow.  Keep all prenatal care appointments with your health care provider. This is important. Contact a health care provider if:  You notice that your baby seems to be moving less than usual. Get help right away if:  You have fluid leaking from your vagina.  You start to have labor pains (contractions). This may feel like a sense of tightening in your lower abdomen.  You have a fever. This information is not intended to replace advice given to you by your health care provider. Make sure you discuss any questions you have with your health care provider. Document Released: 08/13/2010 Document Revised: 10/01/2015 Document Reviewed: 11/08/2014 Elsevier Interactive Patient Education  [employer].

## 2018-01-28 NOTE — Progress Notes (Signed)
  Subjective  Fetal Movement? yes Contractions? no Leaking Fluid? no Vaginal Bleeding? no  Objective  BP 120/80   Wt 164 lb (74.4 kg)   LMP 05/10/2017 (Exact Date)   BMI 26.47 kg/m  General: NAD Pumonary: no increased work of breathing Abdomen: gravid, non-tender Extremities: no edema Psychiatric: mood appropriate, affect full  Assessment  42 y.o. G3P1011 at 3869w4d by  02/14/2018, by Last Menstrual Period presenting for routine prenatal visit  Plan   Problem List Items Addressed This Visit      Other   Supervision of high risk pregnancy, antepartum   AMA (advanced maternal age) multigravida 35+, third trimester    Other Visit Diagnoses    Multigravida of advanced maternal age in third trimester    -  Primary   [redacted] weeks gestation of pregnancy        A NST procedure was performed with FHR monitoring and a normal baseline established, appropriate time of 20-40 minutes of evaluation, and accels >2 seen w 15x15 characteristics.  Results show a REACTIVE NST.   Review of ULTRASOUND.    [facility] personally reviewed images and [facility]    Plan of management to be discussed with patient.    Borderline oligohydramnios discussed.  Repeat US 3 days.  Monitor fetal movements CS planned Oct 1; counseled options for delivery if oligohydramnios develops  Annamarie MajorPaul Harris, MD, Merlinda FrederickFACOG [facility], [facility] 01/28/2018  9:12 AM

## 2018-02-01 ENCOUNTER — Encounter: Payer: Self-pay | Admitting: Obstetrics and Gynecology

## 2018-02-01 ENCOUNTER — Ambulatory Visit (INDEPENDENT_AMBULATORY_CARE_PROVIDER_SITE_OTHER): Payer: BLUE CROSS/BLUE SHIELD | Admitting: Obstetrics and Gynecology

## 2018-02-01 ENCOUNTER — Ambulatory Visit (INDEPENDENT_AMBULATORY_CARE_PROVIDER_SITE_OTHER): Payer: BLUE CROSS/BLUE SHIELD

## 2018-02-01 VITALS — BP 110/70 | Wt 167.0 lb

## 2018-02-01 DIAGNOSIS — O0990 Supervision of high risk pregnancy, unspecified, unspecified trimester: Secondary | ICD-10-CM

## 2018-02-01 DIAGNOSIS — Z3A38 38 weeks gestation of pregnancy: Secondary | ICD-10-CM

## 2018-02-01 DIAGNOSIS — O34219 Maternal care for unspecified type scar from previous cesarean delivery: Secondary | ICD-10-CM

## 2018-02-01 DIAGNOSIS — O09523 Supervision of elderly multigravida, third trimester: Secondary | ICD-10-CM

## 2018-02-01 DIAGNOSIS — Z98891 History of uterine scar from previous surgery: Secondary | ICD-10-CM

## 2018-02-01 DIAGNOSIS — O288 Other abnormal findings on antenatal screening of mother: Secondary | ICD-10-CM

## 2018-02-01 LAB — POCT URINALYSIS DIPSTICK OB
Glucose, UA: NEGATIVE
PROTEIN: NEGATIVE

## 2018-02-01 LAB — FETAL NONSTRESS TEST

## 2018-02-01 NOTE — Progress Notes (Signed)
ROB/AFI/NST C/o Pelvic pain, denies lof.

## 2018-02-01 NOTE — Progress Notes (Signed)
Routine Prenatal Care Visit  Subjective  Erin Rangel is a 42 y.o. G3P1011 at 4333w1d being seen today for ongoing prenatal care.  She is currently monitored for the following issues for this high-risk pregnancy and has Supervision of high risk pregnancy, antepartum; History of cesarean section; and AMA (advanced maternal age) multigravida 35+, third trimester on their problem list.  ----------------------------------------------------------------------------------- Patient reports no complaints.   Contractions: Not present. Vag. Bleeding: None.  Movement: Present. Denies leaking of fluid.  ----------------------------------------------------------------------------------- The following portions of the patient's history were reviewed and updated as appropriate: allergies, current medications, past family history, past medical history, past social history, past surgical history and problem list. Problem list updated.   Objective  Blood pressure 110/70, weight 167 lb (75.8 kg), last menstrual period 05/10/2017. Pregravid weight 145 lb (65.8 kg) Total Weight Gain 22 lb (9.979 kg) Urinalysis:      Fetal Status: Fetal Heart Rate (bpm): 120   Movement: Present     General:  Alert, oriented and cooperative. Patient is in no acute distress.  Skin: Skin is warm and dry. No rash noted.   Cardiovascular: Normal heart rate noted  Respiratory: Normal respiratory effort, no problems with respiration noted  Abdomen: Soft, gravid, appropriate for gestational age. Pain/Pressure: Present     Pelvic:  Cervical exam deferred        Extremities: Normal range of motion.     ental Status: Normal mood and affect. Normal behavior. Normal judgment and thought content.     Assessment   42 y.o. G3P1011 at 8233w1d by  02/14/2018, by Last Menstrual Period presenting for routine prenatal visit  Plan   SECOND Problems (from 11/03/17 to present)    Problem Noted Resolved   AMA (advanced maternal age)  multigravida 35+, third trimester 11/24/2017 by Vena AustriaStaebler, Andreas, MD No   Overview Addendum 01/21/2018  2:36 PM by Vena AustriaStaebler, Andreas, MD    [X]  Growth scan 36 weeks - 7lbs 5oz or 3185g c/w 66/5%ile [X]  APT starting at 36 weeks [X]  Deliver by 40 weeks - C-section 02/09/18      Supervision of high risk pregnancy, antepartum 11/04/2017 by Tresea MallGledhill, Jane, CNM No   Overview Addendum 01/24/2018  3:35 PM by Vena AustriaStaebler, Andreas, MD    Clinic [facility] Prenatal Labs  Dating LMP=8w Blood type: O negative  Genetic Screen Declined Antibody: negative  Anatomic US Normal Rubella: Immune Varicella: Immune  GTT 112 RPR: Non-Reactive  Rhogam 28 wk [X] , PP PRN HBsAg: Negative  TDaP vaccine 12/10/17 Flu Shot: HIV: Negative  Baby Food Breast                               GBS: Negative  Contraception BTL Pap: September 2018/no abnormalities  CBB     CS/VBAC Primary 2015/breech desires repeat   Support Person Husband RJ           History of cesarean section 11/04/2017 by Tresea MallGledhill, Jane, CNM No       Gestational age appropriate obstetric precautions including but not limited to vaginal bleeding, contractions, leaking of fluid and fetal movement were reviewed in detail with the patient.    NST: 125 bpm baseline, moderate variability, 15x15 accelerations, no decelerations. AFI 5.6 today with deepest pocket >2.9  Return in about 1 week (around 02/08/2018) for ROB NST  may already have a preop scheduled.  Adelene Idlerhristanna Braidan Ricciardi MD [facility], [facility] 02/01/18 3:03 PM

## 2018-02-02 ENCOUNTER — Other Ambulatory Visit: Payer: Self-pay | Admitting: Obstetrics & Gynecology

## 2018-02-02 ENCOUNTER — Telehealth: Payer: Self-pay | Admitting: Obstetrics & Gynecology

## 2018-02-02 DIAGNOSIS — O0993 Supervision of high risk pregnancy, unspecified, third trimester: Secondary | ICD-10-CM

## 2018-02-02 NOTE — Telephone Encounter (Signed)
-----   Message from Nadara Mustardobert P Harris, MD sent at 02/02/2018  7:17 AM EDT ----- Regarding: appt w US  Please schedule appt ROB w PH after she has an ultrasound appt for AFI this WED (tomorrow).  Thx. Dr Tiburcio PeaHarris

## 2018-02-02 NOTE — Telephone Encounter (Signed)
Called and left my chart message for patient to confirm schedule [facility]

## 2018-02-03 ENCOUNTER — Ambulatory Visit (INDEPENDENT_AMBULATORY_CARE_PROVIDER_SITE_OTHER): Payer: BLUE CROSS/BLUE SHIELD | Admitting: Obstetrics & Gynecology

## 2018-02-03 ENCOUNTER — Encounter: Payer: Self-pay | Admitting: Obstetrics & Gynecology

## 2018-02-03 ENCOUNTER — Ambulatory Visit (INDEPENDENT_AMBULATORY_CARE_PROVIDER_SITE_OTHER): Payer: BLUE CROSS/BLUE SHIELD

## 2018-02-03 VITALS — BP 100/70 | Wt 169.0 lb

## 2018-02-03 DIAGNOSIS — Z98891 History of uterine scar from previous surgery: Secondary | ICD-10-CM

## 2018-02-03 DIAGNOSIS — Z362 Encounter for other antenatal screening follow-up: Secondary | ICD-10-CM | POA: Diagnosis not present

## 2018-02-03 DIAGNOSIS — O34219 Maternal care for unspecified type scar from previous cesarean delivery: Secondary | ICD-10-CM

## 2018-02-03 DIAGNOSIS — Z3A38 38 weeks gestation of pregnancy: Secondary | ICD-10-CM

## 2018-02-03 DIAGNOSIS — O0990 Supervision of high risk pregnancy, unspecified, unspecified trimester: Secondary | ICD-10-CM

## 2018-02-03 DIAGNOSIS — O09523 Supervision of elderly multigravida, third trimester: Secondary | ICD-10-CM

## 2018-02-03 DIAGNOSIS — O0993 Supervision of high risk pregnancy, unspecified, third trimester: Secondary | ICD-10-CM

## 2018-02-03 NOTE — Progress Notes (Signed)
  HPI: Pt denies pain, bleeding, LOF.  Pt had lower AFI on Monday and is here for recheck.  SHe has been hydrating well.  Ultrasound demonstrates AFI 9 (improved).  PMHx: She  has a past medical history of Frequent headaches (2019). Also,  has a past surgical history that includes Cesarean section (01/31/2014)., family history includes Diabetes in her father; Heart Problems in her father, paternal grandfather, paternal grandmother, paternal uncle, paternal uncle, and paternal uncle; Hypertension in her father, paternal grandfather, and paternal grandmother.,  reports that she has never smoked. She has never used smokeless tobacco. She reports that she drank alcohol. She reports that she does not use drugs.  She has a current medication list which includes the following prescription(s): magnesium and multivitamin-prenatal. Also, is allergic to adhesive [tape].  ROS- neg except as above  Objective: BP 100/70   Wt 169 lb (76.7 kg)   LMP 05/10/2017 (Exact Date)   BMI 27.28 kg/m   Physical examination Constitutional NAD, Conversant  Skin No rashes, lesions or ulceration.   Extremities: Moves all appropriately.  Normal ROM for age. No lymphadenopathy.  Neuro: Grossly intact  Psych: Oriented to PPT.  Normal mood. Normal affect.   Assessment:  Multigravida of advanced maternal age in third trimester  AMA (advanced maternal age) multigravida 35+, third trimester  Supervision of high risk pregnancy, antepartum  [redacted] weeks gestation of pregnancy  History of cesarean section  Keep follow up and preop appt's. Monitor for fetal movement counts daily  Review of ULTRASOUND.    I have personally reviewed images and report of recent ultrasound done at [facility].    Plan of management to be discussed with patient.  Annamarie Major, MD, [facility], [facility] 02/03/2018  4:45 PM

## 2018-02-05 ENCOUNTER — Encounter: Payer: Self-pay | Admitting: Obstetrics and Gynecology

## 2018-02-05 ENCOUNTER — Ambulatory Visit (INDEPENDENT_AMBULATORY_CARE_PROVIDER_SITE_OTHER): Payer: BLUE CROSS/BLUE SHIELD | Admitting: Obstetrics and Gynecology

## 2018-02-05 VITALS — BP 128/82 | HR 74 | Ht 66.0 in | Wt 166.0 lb

## 2018-02-05 DIAGNOSIS — O09523 Supervision of elderly multigravida, third trimester: Secondary | ICD-10-CM

## 2018-02-05 DIAGNOSIS — O34219 Maternal care for unspecified type scar from previous cesarean delivery: Secondary | ICD-10-CM | POA: Diagnosis not present

## 2018-02-05 DIAGNOSIS — O0990 Supervision of high risk pregnancy, unspecified, unspecified trimester: Secondary | ICD-10-CM

## 2018-02-05 DIAGNOSIS — Z3A39 39 weeks gestation of pregnancy: Secondary | ICD-10-CM

## 2018-02-05 DIAGNOSIS — Z98891 History of uterine scar from previous surgery: Secondary | ICD-10-CM

## 2018-02-05 LAB — FETAL NONSTRESS TEST

## 2018-02-05 NOTE — Progress Notes (Signed)
Obstetric H&P   Chief Complaint: Schedule C-section  Prenatal Care Provider: [facility]  History of Present Illness: 42 y.o. G3P1011 [redacted]w[redacted]d by 02/14/2018, by Last Menstrual Period = 8 week [place] presenting for ROB today and scheduled C-section secondary to prior C-section.  Pregnancy complicated by AMA.  +FM, no LOF, no VB, no ctx  Pregravid weight 145 lb (65.8 kg) Total Weight Gain 24 lb (10.9 kg)  SECOND Problems (from 11/03/17 to present)    Problem Noted Resolved   AMA (advanced maternal age) multigravida 35+, third trimester 11/24/2017 by Vena Austria, MD No   Overview Addendum 01/21/2018  2:36 PM by Vena Austria, MD    [X]  Growth scan 36 weeks - 7lbs 5oz or 3185g c/w 66/5%ile [X]  APT starting at 36 weeks [X]  Deliver by 40 weeks - C-section 02/09/18      Supervision of high risk pregnancy, antepartum 11/04/2017 by Tresea Mall, CNM No   Overview Addendum 02/01/2018  3:06 PM by Natale Milch, MD    Clinic [facility] Prenatal Labs  Dating LMP=8w Blood type: O negative  Genetic Screen Declined Antibody: negative  Anatomic [place] Normal Rubella: Immune Varicella: Immune  GTT 112 RPR: Non-Reactive  Rhogam 28 wk [X] , PP PRN HBsAg: Negative  TDaP vaccine 12/10/17 Flu Shot:12/24/17 HIV: Negative  Baby Food Breast                               GBS: Negative  Contraception BTL Pap: September 2018/no abnormalities (not in HM)   CBB     CS/VBAC Primary 2015/breech desires repeat   Support Person Husband RJ           History of cesarean section 11/04/2017 by Tresea Mall, CNM No     Immunization History  Administered Date(s) Administered  . Influenza Inj Mdck Quad Pf 01/30/2016, 07/29/2017  . Influenza,inj,Quad PF,6+ Mos 12/24/2017  . Rho (D) Immune Globulin 11/22/2013  . Tdap 12/15/2013, 12/10/2017      Review of Systems: 10 point review of systems negative unless otherwise noted in HPI  Past Medical History: Past Medical History:  Diagnosis Date  . Frequent  headaches 2019   DURING PREG    Past Surgical History: Past Surgical History:  Procedure Laterality Date  . CESAREAN SECTION  01/31/2014   BREECH    Past Obstetric History: #: 1, Date: None, Sex: None, Weight: None, GA: None, Delivery: None, Apgar1: None, Apgar5: None, Living: None, Birth Comments: None  #: 2, Date: 01/31/14, Sex: Female, Weight: 8 lb 7 oz (3.827 kg), GA: [redacted]w[redacted]d, Delivery: None, Apgar1: None, Apgar5: None, Living: Living, Birth Comments: None  #: 3, Date: None, Sex: None, Weight: None, GA: None, Delivery: None, Apgar1: None, Apgar5: None, Living: None, Birth Comments: None   Past Gynecologic History:  Family History: Family History  Problem Relation Age of Onset  . Diabetes Father   . Heart Problems Father   . Hypertension Father   . Heart Problems Paternal Grandmother   . Hypertension Paternal Grandmother   . Heart Problems Paternal Grandfather   . Hypertension Paternal Grandfather   . Heart Problems Paternal Uncle   . Heart Problems Paternal Uncle   . Heart Problems Paternal Uncle     Social History: Social History   Socioeconomic History  . Marital status: Married    Spouse name: Not on file  . Number of children: 1  . Years of education: 16  . Highest education level: Not  on file  Occupational History  . Occupation: UNEMPLOYED  Social Needs  . Financial resource strain: Not on file  . Food insecurity:    Worry: Not on file    Inability: Not on file  . Transportation needs:    Medical: Not on file    Non-medical: Not on file  Tobacco Use  . Smoking status: Never Smoker  . Smokeless tobacco: Never Used  Substance and Sexual Activity  . Alcohol use: Not Currently  . Drug use: Never  . Sexual activity: Yes    Birth control/protection: None  Lifestyle  . Physical activity:    Days per week: Not on file    Minutes per session: Not on file  . Stress: Not on file  Relationships  . Social connections:    Talks on phone: Not on file     Gets together: Not on file    Attends religious service: Not on file    Active member of club or organization: Not on file    Attends meetings of clubs or organizations: Not on file    Relationship status: Not on file  . Intimate partner violence:    Fear of current or ex partner: Not on file    Emotionally abused: Not on file    Physically abused: Not on file    Forced sexual activity: Not on file  Other Topics Concern  . Not on file  Social History Narrative  . Not on file    Medications: Prior to Admission medications   Medication Sig Start Date End Date Taking? Authorizing Provider  Magnesium 500 MG TABS Take 500 mg by mouth daily.    Yes [provider]  Prenatal Vit-Fe Fumarate-FA (MULTIVITAMIN-PRENATAL) 27-0.8 MG TABS tablet Take 1 tablet by mouth daily.    Yes [provider]    Allergies: Allergies  Allergen Reactions  . Adhesive [Tape] Other (See Comments)    Blister    Physical Exam: Vitals: Blood pressure 128/82, pulse 74, height 5\' 6"  (1.676 m), weight 166 lb (75.3 kg), last menstrual period 05/10/2017.  Baseline: 140 Variability: moderate Accelerations: present Decelerations: absent Tocometry: N/A The patient was monitored for 30 minutes, fetal heart rate tracing was deemed reactive, category I tracing,  General: NAD HEENT: normocephalic, anicteric Pulmonary: No increased work of breathing Cardiovascular: RRR, distal pulses 2+ Abdomen: Gravid, non-tender Leopolds: vtx Genitourinary: deferred Extremities: no edema, erythema, or tenderness Neurologic: Grossly intact Psychiatric: mood appropriate, affect full  Labs: No results found for this or any previous visit (from the past 24 hour(s)).  Assessment: 42 y.o. G3P1011 [redacted]w[redacted]d by 02/14/2018, by Last Menstrual Period = 8 week [place] presenting before scheduled Cesarean section and tubal ligation  Plan: 1) The patient was counseled regarding risk and benefits to proceeding with Cesarean  section to expedite delivery.  Risk of cesarean section were discussed including risk of bleeding and need for potential intraoperative or postoperative blood transfusion with a rate of approximately 5% quoted for all Cesarean sections, risk of injury to adjacent organs including but not limited to bowl and bladder, the need for additional surgical procedures to address such injuries, and the risk of infection.   2) Fetus - + FHT  3) PNL - Blood type   / Anti-bodyscreen Negative (07/16 1002) / Rubella   / Varicella Immune / RPR Non Reactive (07/16 1002) / HBsAg   / HIV Non Reactive (07/16 1002) / 1-hr OGTT 112 / GBS Negative (09/12 1526)  4) Immunization History -  Immunization History  Administered Date(s) Administered  . Influenza Inj Mdck Quad Pf 01/30/2016, 07/29/2017  . Influenza,inj,Quad PF,6+ Mos 12/24/2017  . Rho (D) Immune Globulin 11/22/2013  . Tdap 12/15/2013, 12/10/2017    5) Disposition -  Pending delivery  Vena Austria, MD, [facility], [facility] 02/05/2018, 9:20 AM

## 2018-02-08 ENCOUNTER — Other Ambulatory Visit: Payer: Self-pay

## 2018-02-08 ENCOUNTER — Encounter
Admission: RE | Admit: 2018-02-08 | Discharge: 2018-02-08 | Disposition: A | Discharge status: Home / Self Care | Payer: BLUE CROSS/BLUE SHIELD | Source: Ambulatory Visit | Attending: Obstetrics and Gynecology | Admitting: Obstetrics and Gynecology

## 2018-02-08 DIAGNOSIS — Z01812 Encounter for preprocedural laboratory examination: Secondary | ICD-10-CM

## 2018-02-08 LAB — CBC
HCT: 30 % — ABNORMAL LOW (ref 35.0–47.0)
HEMOGLOBIN: 11.1 g/dL — AB (ref 12.0–16.0)
MCH: 35.3 pg — AB (ref 26.0–34.0)
MCHC: 36.9 g/dL — ABNORMAL HIGH (ref 32.0–36.0)
MCV: 95.7 fL (ref 80.0–100.0)
PLATELETS: 201 10*3/uL (ref 150–440)
RBC: 3.13 MIL/uL — ABNORMAL LOW (ref 3.80–5.20)
RDW: 12.8 % (ref 11.5–14.5)
WBC: 8.3 10*3/uL (ref 3.6–11.0)

## 2018-02-08 NOTE — Patient Instructions (Addendum)
  Report to the [facility] Emergency Department at 5:30 am on Tuesday February 09, 2018 for your scheduled procedure  Remember: Instructions that are not followed completely may result in serious medical risk, up to and including death, or upon the discretion of your surgeon and anesthesiologist your surgery may need to be rescheduled.    _x___ 1. Do not eat food (including mints, candies, chewing gum) after midnight the night before your procedure. You may drink clear liquids up to 2 hours before you are scheduled to arrive at the hospital for your procedure.  Do not drink clear liquids within 2 hours of your scheduled arrival to the hospital.  Clear liquids include  --Water or Apple juice without pulp  --Clear carbohydrate beverage such as Gatorade  --Black Coffee or Clear Tea (No milk, no creamers, do not add anything to the coffee or tea)    __x__ 2. No Alcohol for 24 hours before or after surgery.   __x__ 3. No Smoking or e-cigarettes for 24 prior to surgery.  Do not use any chewable tobacco products for at least 6 hour prior to surgery   __x__ 4. Notify your doctor if there is any change in your medical condition (cold, fever, infections).   __x__ 5. On the morning of surgery brush your teeth with toothpaste and water.  You may rinse your mouth with mouth wash if you wish.  Do not swallow any toothpaste or mouthwash.  Please read over the following fact sheets that you were given:   [facility] Preparing for Surgery and or MRSA Information    __x__ Use CHG Soap or sage wipes as directed on instruction sheet    Do not wear jewelry, make-up, hairpins, clips or nail polish.  Do not wear lotions, powders, deodorant, or perfumes.   Do not shave below the face/neck 48 hours prior to surgery.   Do not bring valuables to the hospital.    [facility] is not responsible for any belongings or valuables.               Contacts, dentures or bridgework may not be worn into surgery.  For patients  admitted to the hospital, discharge time is determined by your treatment team.   _x___ Take anti-hypertensive listed below, cardiac, seizure, asthma, anti-reflux and psychiatric medicines. These include:  1. None   _x___ Follow recommendations from Cardiologist, Pulmonologist or PCP regarding stopping Aspirin, Coumadin, Plavix ,Eliquis, Effient, or Pradaxa, and Pletal.  _x___ Stop Anti-inflammatories such as Advil, Aleve, Ibuprofen, Motrin, Naproxen, Naprosyn, Goodies powders or aspirin products. OK to take Tylenol and Celebrex.   _x___ NOW: Stop supplements until after surgery.  But may continue Vitamin D, Vitamin B, and multivitamin.

## 2018-02-08 NOTE — Pre-Procedure Instructions (Signed)
Positive Ab screen, faxed to Dr. Ramiro Harvest office.

## 2018-02-09 ENCOUNTER — Inpatient Hospital Stay: Payer: BLUE CROSS/BLUE SHIELD | Admitting: Certified Registered Nurse Anesthetist

## 2018-02-09 ENCOUNTER — Inpatient Hospital Stay
Admission: RE | Admit: 2018-02-09 | Discharge: 2018-02-11 | DRG: 785 | Disposition: A | Discharge status: Home / Self Care | Payer: BLUE CROSS/BLUE SHIELD | Attending: Obstetrics and Gynecology | Admitting: Obstetrics and Gynecology

## 2018-02-09 ENCOUNTER — Encounter
Admission: RE | Disposition: A | Discharge status: Home / Self Care | Payer: Self-pay | Source: Home / Self Care | Attending: Obstetrics and Gynecology

## 2018-02-09 DIAGNOSIS — Z302 Encounter for sterilization: Secondary | ICD-10-CM

## 2018-02-09 DIAGNOSIS — O9081 Anemia of the puerperium: Secondary | ICD-10-CM | POA: Diagnosis not present

## 2018-02-09 DIAGNOSIS — O34211 Maternal care for low transverse scar from previous cesarean delivery: Principal | ICD-10-CM | POA: Diagnosis present

## 2018-02-09 DIAGNOSIS — O09523 Supervision of elderly multigravida, third trimester: Secondary | ICD-10-CM | POA: Diagnosis not present

## 2018-02-09 DIAGNOSIS — O34219 Maternal care for unspecified type scar from previous cesarean delivery: Secondary | ICD-10-CM | POA: Diagnosis not present

## 2018-02-09 DIAGNOSIS — Z98891 History of uterine scar from previous surgery: Secondary | ICD-10-CM

## 2018-02-09 DIAGNOSIS — Z3A39 39 weeks gestation of pregnancy: Secondary | ICD-10-CM

## 2018-02-09 LAB — ABO/RH: ABO/RH(D): O NEG

## 2018-02-09 LAB — RPR: RPR Ser Ql: NONREACTIVE

## 2018-02-09 SURGERY — Surgical Case
Anesthesia: Spinal

## 2018-02-09 MED ORDER — NALOXONE HCL 0.4 MG/ML IJ SOLN: 0.4000 mg | INTRAMUSCULAR | Status: DC | PRN

## 2018-02-09 MED ORDER — KETOROLAC TROMETHAMINE 30 MG/ML IJ SOLN: 30.0000 mg | Freq: Four times a day (QID) | INTRAMUSCULAR | Status: AC | PRN

## 2018-02-09 MED ORDER — LACTATED RINGERS IV SOLN
Freq: Once | INTRAVENOUS | Status: AC
  Administered 2018-02-09: 07:00:00 via INTRAVENOUS

## 2018-02-09 MED ORDER — WITCH HAZEL-GLYCERIN EX PADS: 1.0000 "application " | MEDICATED_PAD | CUTANEOUS | Status: DC | PRN

## 2018-02-09 MED ORDER — BUPIVACAINE HCL (PF) 0.5 % IJ SOLN
INTRAMUSCULAR | Status: AC
  Filled 2018-02-09: qty 30

## 2018-02-09 MED ORDER — SODIUM CHLORIDE 0.9% FLUSH: 3.0000 mL | INTRAVENOUS | Status: DC | PRN

## 2018-02-09 MED ORDER — OXYTOCIN 40 UNITS IN LACTATED RINGERS INFUSION - SIMPLE MED
2.5000 [IU]/h | INTRAVENOUS | Status: AC
  Administered 2018-02-09: 2.5 [IU]/h via INTRAVENOUS

## 2018-02-09 MED ORDER — OXYCODONE HCL 5 MG PO TABS
5.0000 mg | ORAL_TABLET | ORAL | Status: DC | PRN
  Administered 2018-02-10 – 2018-02-11 (×3): 5 mg via ORAL
  Filled 2018-02-09 (×3): qty 1

## 2018-02-09 MED ORDER — PRENATAL MULTIVITAMIN CH
1.0000 | ORAL_TABLET | Freq: Every day | ORAL | Status: DC
  Administered 2018-02-10 – 2018-02-11 (×2): 1 via ORAL
  Filled 2018-02-09 (×3): qty 1

## 2018-02-09 MED ORDER — EPHEDRINE SULFATE 50 MG/ML IJ SOLN
INTRAMUSCULAR | Status: AC
  Filled 2018-02-09: qty 1

## 2018-02-09 MED ORDER — IBUPROFEN 600 MG PO TABS
600.0000 mg | ORAL_TABLET | Freq: Four times a day (QID) | ORAL | Status: DC | PRN
  Administered 2018-02-10 – 2018-02-11 (×3): 600 mg via ORAL
  Filled 2018-02-09 (×3): qty 1

## 2018-02-09 MED ORDER — SODIUM CHLORIDE 0.9 % IV SOLN
INTRAVENOUS | Status: DC | PRN
  Administered 2018-02-09: 40 ug/min via INTRAVENOUS

## 2018-02-09 MED ORDER — NALBUPHINE HCL 10 MG/ML IJ SOLN: 5.0000 mg | INTRAMUSCULAR | Status: DC | PRN

## 2018-02-09 MED ORDER — NALBUPHINE HCL 10 MG/ML IJ SOLN: 5.0000 mg | Freq: Once | INTRAMUSCULAR | Status: DC | PRN

## 2018-02-09 MED ORDER — DEXAMETHASONE SODIUM PHOSPHATE 10 MG/ML IJ SOLN
INTRAMUSCULAR | Status: AC
  Filled 2018-02-09: qty 1

## 2018-02-09 MED ORDER — EPHEDRINE SULFATE 50 MG/ML IJ SOLN
INTRAMUSCULAR | Status: DC | PRN
  Administered 2018-02-09: 10 mg via INTRAVENOUS

## 2018-02-09 MED ORDER — DIPHENHYDRAMINE HCL 25 MG PO CAPS: 25.0000 mg | ORAL_CAPSULE | ORAL | Status: DC | PRN

## 2018-02-09 MED ORDER — BUPIVACAINE 0.25 % ON-Q PUMP DUAL CATH 400 ML
400.0000 mL | INJECTION | Status: DC
  Filled 2018-02-09: qty 400

## 2018-02-09 MED ORDER — ACETAMINOPHEN 325 MG PO TABS
650.0000 mg | ORAL_TABLET | ORAL | Status: DC | PRN
  Administered 2018-02-09 – 2018-02-11 (×7): 650 mg via ORAL
  Filled 2018-02-09 (×7): qty 2

## 2018-02-09 MED ORDER — BUPIVACAINE HCL (PF) 0.5 % IJ SOLN
INTRAMUSCULAR | Status: DC | PRN
  Administered 2018-02-09: 10 mL

## 2018-02-09 MED ORDER — COCONUT OIL OIL: 1.0000 "application " | TOPICAL_OIL | Status: DC | PRN

## 2018-02-09 MED ORDER — SOD CITRATE-CITRIC ACID 500-334 MG/5ML PO SOLN
30.0000 mL | ORAL | Status: AC
  Administered 2018-02-09: 30 mL via ORAL
  Filled 2018-02-09: qty 30

## 2018-02-09 MED ORDER — SIMETHICONE 80 MG PO CHEW
80.0000 mg | CHEWABLE_TABLET | Freq: Three times a day (TID) | ORAL | Status: DC
  Administered 2018-02-09 – 2018-02-11 (×7): 80 mg via ORAL
  Filled 2018-02-09 (×7): qty 1

## 2018-02-09 MED ORDER — LACTATED RINGERS IV SOLN: INTRAVENOUS | Status: DC

## 2018-02-09 MED ORDER — BUPIVACAINE IN DEXTROSE 0.75-8.25 % IT SOLN
INTRATHECAL | Status: DC | PRN
  Administered 2018-02-09: 1.7 mL via INTRATHECAL

## 2018-02-09 MED ORDER — PHENYLEPHRINE HCL 10 MG/ML IJ SOLN
INTRAMUSCULAR | Status: DC | PRN
  Administered 2018-02-09 (×2): 100 ug via INTRAVENOUS

## 2018-02-09 MED ORDER — OXYCODONE-ACETAMINOPHEN 5-325 MG PO TABS: 1.0000 | ORAL_TABLET | ORAL | Status: DC | PRN

## 2018-02-09 MED ORDER — LIDOCAINE HCL (PF) 1 % IJ SOLN
INTRAMUSCULAR | Status: DC | PRN
  Administered 2018-02-09: 3 mL via SUBCUTANEOUS

## 2018-02-09 MED ORDER — KETOROLAC TROMETHAMINE 30 MG/ML IJ SOLN
30.0000 mg | Freq: Four times a day (QID) | INTRAMUSCULAR | Status: AC | PRN
  Administered 2018-02-09 – 2018-02-10 (×4): 30 mg via INTRAVENOUS
  Filled 2018-02-09 (×4): qty 1

## 2018-02-09 MED ORDER — OXYCODONE-ACETAMINOPHEN 5-325 MG PO TABS: 2.0000 | ORAL_TABLET | ORAL | Status: DC | PRN

## 2018-02-09 MED ORDER — PHENYLEPHRINE HCL 10 MG/ML IJ SOLN
INTRAMUSCULAR | Status: AC
  Filled 2018-02-09: qty 1

## 2018-02-09 MED ORDER — BUPIVACAINE HCL 0.5 % IJ SOLN: 50.0000 mL | Freq: Once | INTRAMUSCULAR | Status: DC

## 2018-02-09 MED ORDER — OXYTOCIN 40 UNITS IN LACTATED RINGERS INFUSION - SIMPLE MED
INTRAVENOUS | Status: DC | PRN
  Administered 2018-02-09: 400 mL via INTRAVENOUS

## 2018-02-09 MED ORDER — MENTHOL 3 MG MT LOZG
1.0000 | LOZENGE | OROMUCOSAL | Status: DC | PRN
  Filled 2018-02-09: qty 9

## 2018-02-09 MED ORDER — SENNOSIDES-DOCUSATE SODIUM 8.6-50 MG PO TABS
2.0000 | ORAL_TABLET | ORAL | Status: DC
  Administered 2018-02-10 – 2018-02-11 (×2): 2 via ORAL
  Filled 2018-02-09 (×2): qty 2

## 2018-02-09 MED ORDER — OXYTOCIN 10 UNIT/ML IJ SOLN
INTRAMUSCULAR | Status: AC
  Filled 2018-02-09: qty 4

## 2018-02-09 MED ORDER — FENTANYL CITRATE (PF) 100 MCG/2ML IJ SOLN: 25.0000 ug | INTRAMUSCULAR | Status: DC | PRN

## 2018-02-09 MED ORDER — ONDANSETRON HCL 4 MG/2ML IJ SOLN
INTRAMUSCULAR | Status: AC
  Filled 2018-02-09: qty 2

## 2018-02-09 MED ORDER — DEXAMETHASONE SODIUM PHOSPHATE 10 MG/ML IJ SOLN
INTRAMUSCULAR | Status: DC | PRN
  Administered 2018-02-09: 4 mg via INTRAVENOUS

## 2018-02-09 MED ORDER — NALOXONE HCL 4 MG/10ML IJ SOLN
1.0000 ug/kg/h | INTRAVENOUS | Status: DC | PRN
  Filled 2018-02-09: qty 5

## 2018-02-09 MED ORDER — OXYTOCIN 40 UNITS IN LACTATED RINGERS INFUSION - SIMPLE MED
INTRAVENOUS | Status: AC
  Filled 2018-02-09: qty 1000

## 2018-02-09 MED ORDER — FENTANYL CITRATE (PF) 100 MCG/2ML IJ SOLN
INTRAMUSCULAR | Status: DC | PRN
  Administered 2018-02-09: 20 ug via INTRAVENOUS

## 2018-02-09 MED ORDER — MORPHINE SULFATE (PF) 0.5 MG/ML IJ SOLN
INTRAMUSCULAR | Status: DC | PRN
  Administered 2018-02-09: .1 mg via EPIDURAL

## 2018-02-09 MED ORDER — SIMETHICONE 80 MG PO CHEW
80.0000 mg | CHEWABLE_TABLET | ORAL | Status: DC
  Administered 2018-02-10 – 2018-02-11 (×2): 80 mg via ORAL
  Filled 2018-02-09 (×2): qty 1

## 2018-02-09 MED ORDER — DIPHENHYDRAMINE HCL 25 MG PO CAPS: 25.0000 mg | ORAL_CAPSULE | Freq: Four times a day (QID) | ORAL | Status: DC | PRN

## 2018-02-09 MED ORDER — MORPHINE SULFATE (PF) 0.5 MG/ML IJ SOLN
INTRAMUSCULAR | Status: AC
  Filled 2018-02-09: qty 10

## 2018-02-09 MED ORDER — ONDANSETRON HCL 4 MG/2ML IJ SOLN
INTRAMUSCULAR | Status: DC | PRN
  Administered 2018-02-09: 4 mg via INTRAVENOUS

## 2018-02-09 MED ORDER — DIPHENHYDRAMINE HCL 50 MG/ML IJ SOLN: 12.5000 mg | INTRAMUSCULAR | Status: DC | PRN

## 2018-02-09 MED ORDER — KETOROLAC TROMETHAMINE 30 MG/ML IJ SOLN
INTRAMUSCULAR | Status: AC
  Filled 2018-02-09: qty 1

## 2018-02-09 MED ORDER — ONDANSETRON HCL 4 MG/2ML IJ SOLN: 4.0000 mg | Freq: Once | INTRAMUSCULAR | Status: DC | PRN

## 2018-02-09 MED ORDER — LACTATED RINGERS IV SOLN
INTRAVENOUS | Status: DC
  Administered 2018-02-09 (×2): via INTRAVENOUS

## 2018-02-09 MED ORDER — FENTANYL CITRATE (PF) 100 MCG/2ML IJ SOLN
INTRAMUSCULAR | Status: AC
  Filled 2018-02-09: qty 2

## 2018-02-09 MED ORDER — SIMETHICONE 80 MG PO CHEW: 80.0000 mg | CHEWABLE_TABLET | ORAL | Status: DC | PRN

## 2018-02-09 MED ORDER — CEFAZOLIN SODIUM-DEXTROSE 2-4 GM/100ML-% IV SOLN
2.0000 g | INTRAVENOUS | Status: AC
  Administered 2018-02-09: 2 g via INTRAVENOUS
  Filled 2018-02-09: qty 100

## 2018-02-09 MED ORDER — DIBUCAINE 1 % RE OINT: 1.0000 "application " | TOPICAL_OINTMENT | RECTAL | Status: DC | PRN

## 2018-02-09 SURGICAL SUPPLY — 27 items
BAG COUNTER SPONGE EZ (MISCELLANEOUS) ×2 IMPLANT
CANISTER SUCT 3000ML PPV (MISCELLANEOUS) ×3 IMPLANT
CATH KIT ON-Q SILVERSOAK 5IN (CATHETERS) ×6 IMPLANT
CHLORAPREP W/TINT 26ML (MISCELLANEOUS) ×6 IMPLANT
CLOSURE WOUND 1/2 X4 (GAUZE/BANDAGES/DRESSINGS) ×1
COUNTER SPONGE BAG EZ (MISCELLANEOUS) ×1
DERMABOND ADVANCED (GAUZE/BANDAGES/DRESSINGS) ×2
DERMABOND ADVANCED .7 DNX12 (GAUZE/BANDAGES/DRESSINGS) ×1 IMPLANT
DRSG TELFA 3X8 NADH (GAUZE/BANDAGES/DRESSINGS) ×3 IMPLANT
ELECT CAUTERY BLADE 6.4 (BLADE) IMPLANT
ELECT REM PT RETURN 9FT ADLT (ELECTROSURGICAL) ×3
ELECTRODE REM PT RTRN 9FT ADLT (ELECTROSURGICAL) ×1 IMPLANT
GAUZE SPONGE 4X4 12PLY STRL (GAUZE/BANDAGES/DRESSINGS) ×3 IMPLANT
GLOVE BIO SURGEON STRL SZ7 (GLOVE) ×15 IMPLANT
GLOVE INDICATOR 7.5 STRL GRN (GLOVE) ×15 IMPLANT
GOWN STRL REUS W/ TWL LRG LVL3 (GOWN DISPOSABLE) ×3 IMPLANT
GOWN STRL REUS W/TWL LRG LVL3 (GOWN DISPOSABLE) ×6
NS IRRIG 1000ML POUR BTL (IV SOLUTION) ×3 IMPLANT
PACK C SECTION AR (MISCELLANEOUS) ×3 IMPLANT
PAD OB MATERNITY 4.3X12.25 (PERSONAL CARE ITEMS) ×3 IMPLANT
PAD PREP 24X41 OB/GYN DISP (PERSONAL CARE ITEMS) ×3 IMPLANT
STRIP CLOSURE SKIN 1/2X4 (GAUZE/BANDAGES/DRESSINGS) ×2 IMPLANT
SUT MNCRL AB 4-0 PS2 18 (SUTURE) IMPLANT
SUT PDS AB 1 TP1 96 (SUTURE) ×12 IMPLANT
SUT VIC AB 0 CTX 36 (SUTURE) ×4
SUT VIC AB 0 CTX36XBRD ANBCTRL (SUTURE) ×2 IMPLANT
SUT VIC AB 2-0 CT1 36 (SUTURE) IMPLANT

## 2018-02-09 NOTE — Anesthesia Preprocedure Evaluation (Signed)
Anesthesia Evaluation  Patient identified by MRN, date of birth, ID band Patient awake    Reviewed: Allergy & Precautions, H&P , NPO status , Patient's Chart, lab work & pertinent test results, reviewed documented beta blocker date and time   History of Anesthesia Complications Negative for: history of anesthetic complications  Airway Mallampati: III  TM Distance: >3 FB Neck ROM: full    Dental  (+) Caps, Dental Advidsory Given   Pulmonary neg pulmonary ROS,           Cardiovascular Exercise Tolerance: Good negative cardio ROS       Neuro/Psych negative neurological ROS  negative psych ROS   GI/Hepatic Neg liver ROS, GERD  ,  Endo/Other  negative endocrine ROS  Renal/GU negative Renal ROS  negative genitourinary   Musculoskeletal   Abdominal   Peds  Hematology negative hematology ROS (+)   Anesthesia Other Findings Past Medical History: 2019: Frequent headaches     Comment:  DURING PREG   Reproductive/Obstetrics (+) Pregnancy                             Anesthesia Physical Anesthesia Plan  ASA: II  Anesthesia Plan: Spinal   Post-op Pain Management:    Induction:   PONV Risk Score and Plan:   Airway Management Planned: Natural Airway and Nasal Cannula  Additional Equipment:   Intra-op Plan:   Post-operative Plan:   Informed Consent: I have reviewed the patients History and Physical, chart, labs and discussed the procedure including the risks, benefits and alternatives for the proposed anesthesia with the patient or authorized representative who has indicated his/her understanding and acceptance.   Dental Advisory Given  Plan Discussed with: Anesthesiologist, CRNA and Surgeon  Anesthesia Plan Comments:         Anesthesia Quick Evaluation

## 2018-02-09 NOTE — Progress Notes (Signed)
Date of Initial H&P: 02/05/2018  History reviewed, patient examined, no change in status, stable for surgery.

## 2018-02-09 NOTE — Op Note (Signed)
Preoperative Diagnosis: 1) 42 y.o. G3P1011 at [redacted]w[redacted]d 2) Desires permanent surgical sterilization 3) History of prior cesarean section  Postoperative Diagnosis: 1) 42 y.o. G3P1011 at [redacted]w[redacted]d 2) Desires permanent surgical sterilization 3) History of prior cesarean section  Operation Performed: Repeat low transverse C-section via pfannenstiel skin incision and Pomeroy tubal ligation  Anesthesia: .Choice  Primary Surgeon: Vena Austria, MD  Assistant: Annamarie Major, MD.  This procedure required a high level surgical assistant, with none readily available.  Preoperative Antibiotics: 2g ancef  Estimated Blood Loss: 760 mL  IV Fluids: 1L  Drains or Tubes: Foley to gravity drainage, ON-Q catheter system  Implants: none  Specimens Removed: Bilateral segments of fallopian tube.    Complications: none  Intraoperative Findings:  Normal tubes ovaries and uterus.  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  Delivery resulted in the birth of a liveborn female, APGAR (1 MIN): 10  APGAR (5 MINS): 10, weight 7lbs 7oz  Patient Condition: stable  Procedure in Detail:  Patient was taken to the operating room were she was administered regional anesthesia.  She was positioned in the supine position, prepped and draped in the  Usual sterile fashion.  Prior to proceeding with the case a time out was performed and the level of anesthetic was checked and noted to be adequate.  Utilizing the scalpel a pfannenstiel skin incision was made 2cm above the pubic symphysis utilizing the patient's pre-existing scar and carried down sharply to the the level of the rectus fascia.  The fascia was incised in the midline using the scalpel and then extended using mayo scissors.  The superior border of the rectus fascia was grasped with two Kocher clamps and the underlying rectus muscles were dissected of the fascia using blunt dissection.  The median raphae was incised using Mayo  scissors.   The inferior border of the rectus fascia was dissected of the rectus muscles in a similar fashion.  The midline was identified, the peritoneum was entered bluntly and expanded using manual tractions.  The uterus was noted to be in a none rotated position.  Next the bladder blade was placed retracting the bladder caudally.  A bladder flap was created. The bladder reflection was grasped with a pickup, and Metzenbaum scissors were then used the undermine the bladder reflection.  The bladder flap was developed using digital dissection.  The bladder blade was replaced retracting the bladder caudally out of the operative field.    A low transverse incision was scored on the lower uterine segment.  The hysterotomy was entered bluntly using the operators finger.  The hysterotomy incision was extended using manual traction.  The operators hand was placed within the hysterotomy position noting the fetus to be within the OA position.  The vertex was grasped, flexed, brought to the incision, and delivered a traumatically using fundal pressure.  The remainder of the body delivered with ease.  The infant was suctioned, cord was clamped and cut before handing off to the awaiting neonatologist.  The placenta was delivered using manual extraction.  The uterus was exteriorized, wiped clean of clots and debris using two moist laps.  The hysterotomy was closed using a two layer closure of 0 Vicryl, with the first being a running locked, the second a vertical imbricating.  The right tube was grasped in a mid isthmic portion using a Babcock clamp, before being double suture ligated using a 0 chromic wheel.  The intervening nuckel of tube was excised using Metzenbaum scissors.  Complete cross section of tubal ostia visualized, and noted to be hemostatic  This procedure was then repeated in similar fashion for the patient left tube.    The uterus was returned to the abdomen.  The peritoneal gutters were wiped clean of clots  and debris using two moist laps.  The hysterotomy incision and tubal pedicles were re-inspected noted to be hemostatic.   The rectus muscles were inspected noted to be hemostatic.  The superior border of the rectus fascia was grasped with a Kocher clamp.  The ON-Q trocars were then placed 4cm above the superior border of the incision and tunneled subfascially.  The introducers were removed and the catheters were threaded through the sleeves after which the sleeves were removed.  The fascia was closed using a looped #1 PDS in a running fashion taking 1cm by 1cm bites.  The subcutaneous tissue was irrigated using warm saline, hemostasis achieved using the bovie.  The subcutaneous dead space was less than 3cm and was not closed.  The skin was closed using 4-0 Monocryl in a subcuticular fashion.  Sponge needle and instrument counts were corrects times two.  The patient tolerated the procedure well and was taken to the recovery room in stable condition.

## 2018-02-09 NOTE — Discharge Summary (Signed)
Obstetric Discharge Summary Reason for Admission: cesarean section Prenatal Procedures: none Intrapartum Procedures: cesarean: low cervical, transverse Postpartum Procedures: none Complications-Operative and Postpartum: postpartum anemia Hemoglobin  Date Value Ref Range Status  02/10/2018 8.0 (L) 12.0 - 16.0 g/dL Final    Comment:    RESULT REPEATED AND VERIFIED  11/24/2017 11.1 11.1 - 15.9 g/dL Final   HCT  Date Value Ref Range Status  02/10/2018 22.0 (L) 35.0 - 47.0 % Final   Hematocrit  Date Value Ref Range Status  11/24/2017 32.3 (L) 34.0 - 46.6 % Final    Physical Exam:  General: alert, cooperative and no distress. Denies lightheadedness when OOB ambulating. Tolerating regular diet. Passing flatus and has had BM x 2. Breast feeding going well. Heart: RRR without murmur Lungs: CTAB, normal respiratory effort Lochia: appropriate Uterine Fundus: firm/ U-2/ML/NT Incision: Honey comb dressing with some dried blood, but intact. ON Q intact DVT Evaluation: No evidence of DVT seen on physical exam.  Discharge Diagnoses: Term Pregnancy-delivered  Discharge Information: Date: 02/11/2018 Activity: pelvic rest and no lifting over 10lbs for 6 weeks, no driving for 2 weeks Diet: routine Allergies as of 02/11/2018      Reactions   Adhesive [tape] Other (See Comments)   Blister      Medication List    STOP taking these medications   Magnesium 500 MG Tabs     TAKE these medications   docusate sodium 100 MG capsule Commonly known as:  COLACE Take 1 capsule (100 mg total) by mouth 2 (two) times daily as needed for mild constipation or moderate constipation.   ferrous sulfate 325 (65 FE) MG tablet Take 1 tablet (325 mg total) by mouth 2 (two) times daily with a meal.   ibuprofen 600 MG tablet Commonly known as:  ADVIL,MOTRIN Take 1 tablet (600 mg total) by mouth every 6 (six) hours as needed for mild pain or cramping. Notes to patient:  Next Available: 230pm    multivitamin-prenatal 27-0.8 MG Tabs tablet Take 1 tablet by mouth daily.   oxyCODONE-acetaminophen 5-325 MG tablet Commonly known as:  PERCOCET/ROXICET Take 1-2 tablets by mouth every 6 (six) hours as needed for up to 7 days for moderate pain or severe pain.       Condition: stable Discharge to: home   Newborn Data: Live born female Maggie Birth Weight: 7 lb 6.5 oz (3360 g) APGAR: 10, 10  Newborn Delivery   Birth date/time:  02/09/2018 08:15:00 Delivery type:  C-Section, Low Transverse Trial of labor:  No C-section categorization:  Repeat     Home with mother.  Farrel Conners 02/11/2018, 1:00 PM

## 2018-02-09 NOTE — Anesthesia Post-op Follow-up Note (Signed)
Anesthesia QCDR form completed.        

## 2018-02-09 NOTE — Anesthesia Procedure Notes (Addendum)
Spinal  Patient location during procedure: OR Start time: 02/09/2018 7:56 AM Staffing Other anesthesia staff: Launa Grill, RN Performed: other anesthesia staff  Preanesthetic Checklist Completed: patient identified, site marked, surgical consent, pre-op evaluation, timeout performed, IV checked, risks and benefits discussed and monitors and equipment checked Spinal Block Patient position: sitting Prep: Betadine Patient monitoring: heart rate, continuous pulse ox, blood pressure and cardiac monitor Approach: midline Location: L4-5 Injection technique: single-shot Needle Needle type: Whitacre and Introducer  Needle gauge: 24 G Needle length: 9 cm Assessment Sensory level: T4 Additional Notes Negative paresthesia. Negative blood return. Positive free-flowing CSF. Expiration date of kit checked and confirmed. Patient tolerated procedure well, without complications.

## 2018-02-09 NOTE — Transfer of Care (Signed)
Immediate Anesthesia Transfer of Care Note  Patient: Erin Rangel  Procedure(s) Performed: CESAREAN SECTION WITH BILATERAL TUBAL LIGATION (N/A )  Patient Location: PACU  Anesthesia Type:Spinal  Level of Consciousness: awake, alert  and oriented  Airway & Oxygen Therapy: Patient Spontanous Breathing  Post-op Assessment: Report given to RN and Post -op Vital signs reviewed and stable  Post vital signs: Reviewed and stable  Last Vitals:  Vitals Value Taken Time  BP    Temp    Pulse 70 02/09/2018  9:15 AM  Resp 15 02/09/2018  9:15 AM  SpO2 100 % 02/09/2018  9:15 AM  Vitals shown include unvalidated device data.  Last Pain:  Vitals:   02/09/18 0744  TempSrc: Oral  PainSc:          Complications: No apparent anesthesia complications

## 2018-02-10 LAB — TYPE AND SCREEN
ABO/RH(D): O NEG
Antibody Screen: POSITIVE
EXTEND SAMPLE REASON: UNDETERMINED
UNIT DIVISION: 0
Unit division: 0

## 2018-02-10 LAB — CBC
HEMATOCRIT: 22 % — AB (ref 35.0–47.0)
Hemoglobin: 8 g/dL — ABNORMAL LOW (ref 12.0–16.0)
MCH: 34.6 pg — ABNORMAL HIGH (ref 26.0–34.0)
MCHC: 36.4 g/dL — AB (ref 32.0–36.0)
MCV: 94.9 fL (ref 80.0–100.0)
Platelets: 191 10*3/uL (ref 150–440)
RBC: 2.32 MIL/uL — ABNORMAL LOW (ref 3.80–5.20)
RDW: 12.7 % (ref 11.5–14.5)
WBC: 12.4 10*3/uL — ABNORMAL HIGH (ref 3.6–11.0)

## 2018-02-10 LAB — BPAM RBC
BLOOD PRODUCT EXPIRATION DATE: 201911052359
Blood Product Expiration Date: 201911062359
UNIT TYPE AND RH: 9500
Unit Type and Rh: 9500

## 2018-02-10 LAB — SURGICAL PATHOLOGY

## 2018-02-10 NOTE — Lactation Note (Signed)
This note was copied from a baby's chart. Lactation Consultation Note  Patient Name: Erin Rangel Date: 02/10/2018 Reason for consult: Initial assessment   Maternal Data Formula Feeding for Exclusion: No Has patient been taught Hand Expression?: Yes Does the patient have breastfeeding experience prior to this delivery?: Yes  Feeding Feeding Type: Breast Fed Length of feed: 20 min  LATCH Score Latch: Grasps breast easily, tongue down, lips flanged, rhythmical sucking.  Audible Swallowing: Spontaneous and intermittent  Type of Nipple: Everted at rest and after stimulation  Comfort (Breast/Nipple): Soft / non-tender  Hold (Positioning): No assistance needed to correctly position infant at breast.  LATCH Score: 10  Interventions Interventions: Breast feeding basics reviewed  Lactation Tools Discussed/Used     Consult Status Consult Status: PRN  Parents called LC to room to assist with latch and positioning. Infant was placed on the left breast. Mother states that yesterday infant had trouble latching on the left side and she needed assistance. Mother has infant in the cradle position. Mother sandwiched her breast and was able to successfully latch infant. LC pulled down on infant's chin to flange lips out more. Audible swallows heard. LC educated parents that they may have to help infant achieve a deeper latch by pulling down on chin to flange lips and not waiting until infant is crying to attempt breastfeeding and trying skin to skin to calm. Parents were also educated on how to tell if infant is getting enough, frequency of wet and dirty diapers and hearing swallows.   [name] 02/10/2018, 10:51 AM

## 2018-02-10 NOTE — Anesthesia Postprocedure Evaluation (Cosign Needed)
Anesthesia Post Note  Patient: Erin Rangel  Procedure(s) Performed: CESAREAN SECTION WITH BILATERAL TUBAL LIGATION (N/A )  Patient location during evaluation: Mother Baby Anesthesia Type: Spinal Level of consciousness: awake, awake and alert, oriented and patient cooperative Pain management: pain level controlled Vital Signs Assessment: post-procedure vital signs reviewed and stable Respiratory status: spontaneous breathing, nonlabored ventilation and respiratory function stable Cardiovascular status: stable Postop Assessment: no headache, no backache, patient able to bend at knees, able to ambulate and adequate PO intake Anesthetic complications: no     Last Vitals:  Vitals:   02/10/18 0007 02/10/18 0726  BP: 115/74 120/83  Pulse: 73 76  Resp: 20 18  Temp: 36.9 C 36.5 C  SpO2: 98% 100%    Last Pain:  Vitals:   02/10/18 0745  TempSrc:   PainSc: 0-No pain                 Fredick Schlosser,  Fernando Stoiber R

## 2018-02-10 NOTE — Progress Notes (Signed)
  Subjective:   Post Op Day 1 Doing well, she is tolerating PO intake. Her pain is well controlled with PO pain medications and On Q pump. She is ambulating and voiding without difficulty. She reports breastfeeding is going well.  Objective:  Blood pressure 120/83, pulse 76, temperature 97.7 F (36.5 C), temperature source Axillary, resp. rate 18, height 5\' 6"  (1.676 m), weight 75.3 kg, last menstrual period 05/10/2017, SpO2 100 %, currently breastfeeding.  General: NAD Pulmonary: no increased work of breathing Abdomen: non-distended, non-tender, fundus firm at level of umbilicus Incision: C/D/I, On Q pump is intact Extremities: no edema, no erythema, no tenderness  Results for orders placed or performed during the hospital encounter of 02/09/18 (from the past 24 hour(s))  CBC     Status: Abnormal   Collection Time: 02/10/18  4:55 AM  Result Value Ref Range   WBC 12.4 (H) 3.6 - 11.0 K/uL   RBC 2.32 (L) 3.80 - 5.20 MIL/uL   Hemoglobin 8.0 (L) 12.0 - 16.0 g/dL   HCT 81.1 (L) 91.4 - 78.2 %   MCV 94.9 80.0 - 100.0 fL   MCH 34.6 (H) 26.0 - 34.0 pg   MCHC 36.4 (H) 32.0 - 36.0 g/dL   RDW 95.6 21.3 - 08.6 %   Platelets 191 150 - 440 K/uL    Intake/Output Summary (Last 24 hours) at 02/10/2018 0957 Last data filed at 02/10/2018 0500 Gross per 24 hour  Intake 942.71 ml  Output 2540 ml  Net -1597.29 ml      Assessment:   42 y.o. [id number] postoperativeday # 1   Plan:  1) Acute blood loss anemia - hemodynamically stable and asymptomatic - po ferrous sulfate  2) O negative: Rhogam is not indicated- baby is also O negative  3) TDAP status: given antepartum   4) Breast/Contraception: Bilateral Tubal Ligation  5) Disposition: Continue routine post c/section care   Tresea Mall, CNM [facility], [facility]

## 2018-02-10 NOTE — Plan of Care (Signed)
Vs stable; up ad lib; able to have IV converted to SL; taking tylenol PO and toradol IV for pain control; old dried drainage to honeycomb dressing; tolerating regular diet; breastfeeding and doing so with minimal assistance from nurse

## 2018-02-11 DIAGNOSIS — O9081 Anemia of the puerperium: Secondary | ICD-10-CM

## 2018-02-11 MED ORDER — FERROUS SULFATE 325 (65 FE) MG PO TABS: 325.0000 mg | ORAL_TABLET | Freq: Two times a day (BID) | ORAL | 1 refills | Status: DC

## 2018-02-11 MED ORDER — IBUPROFEN 600 MG PO TABS: 600.0000 mg | ORAL_TABLET | Freq: Four times a day (QID) | ORAL | 1 refills | Status: DC | PRN

## 2018-02-11 MED ORDER — DOCUSATE SODIUM 100 MG PO CAPS: 100.0000 mg | ORAL_CAPSULE | Freq: Two times a day (BID) | ORAL | 2 refills | Status: DC | PRN

## 2018-02-11 MED ORDER — OXYCODONE-ACETAMINOPHEN 5-325 MG PO TABS: 1.0000 | ORAL_TABLET | Freq: Four times a day (QID) | ORAL | 0 refills | Status: AC | PRN

## 2018-02-11 NOTE — Lactation Note (Signed)
This note was copied from a baby's chart. Lactation Consultation Note  Patient Name: Erin Rangel [id number] Date: 02/11/2018     Maternal Data    Feeding Feeding Type: Breast Fed  LATCH Score                   Interventions    Lactation Tools Discussed/Used     Consult Status  LC to room to check on Mother and the progress of breastfeeding. Mother states that she and Father of Baby have been pulling infant's chin down to help with a deeper latch and it has been working well. Parents denied any questions or concerns. LC reminded mother of the outpatient lactation clinic and the breastfeeding support group at the hospital.    [name] 02/11/2018, 10:26 AM

## 2018-02-11 NOTE — Progress Notes (Signed)
Patient discharged home with infant. Discharge instructions and prescriptions given and reviewed with patient. ON-Q pump removal education and incision cleaning instructions reviewed. Patient verbalized understanding. Patient will be escorted out by auxillary.

## 2018-02-11 NOTE — Plan of Care (Signed)
Vs stable; up ad lib; tolerating regular diet; taking motrin and tylenol and occasional oxycodone for pain control; breastfeeding and has declined assistance this shift

## 2018-02-11 NOTE — Discharge Instructions (Signed)
Cesarean Delivery, Care After Refer to this sheet in the next few weeks. These instructions provide you with information on caring for yourself after your procedure. Your health care provider may also give you specific instructions. Your treatment has been planned according to current medical practices, but problems sometimes occur. Call your health care provider if you have any problems or questions after you go home. HOME CARE INSTRUCTIONS  If you have an On-Q pump, remove it on the 4th day after your surgery, by removing the dressing/bandage and pulling the pump out. Cover the site where the pump strings came out with a band-aid, as needed.  Only take over-the-counter or prescription medications as directed by your health care provider.  Do not drink alcohol, especially if you are breastfeeding or taking medication to relieve pain.  Do not  smoke tobacco.  Continue to use good perineal care. Good perineal care includes:  Wiping your perineum from front to back.  Keeping your perineum clean.  Check your surgical cut (incision) daily for increased redness, drainage, swelling, or separation of skin.  Shower and clean your incision gently with soap and water every day, by letting warm and soapy water run over the incision, and then pat it dry once your dressing is removed. If your health care provider says it is okay, leave the incision uncovered. Use a bandage (dressing) if the incision is draining fluid or appears irritated.   Hug a pillow when coughing or sneezing until your incision is healed. This helps to relieve pain.  Do not use tampons, douches or have sexual intercourse for 6 weeks  Wear a well-fitting bra that provides breast support.  Limit wearing support panties or control-top hose.  Drink enough fluids to keep your urine clear or pale yellow.  Eat high-fiber foods such as whole grain cereals and breads, brown rice, beans, and fresh fruits and vegetables every day.  These foods may help prevent or relieve constipation.  Resume activities such as climbing stairs, driving, lifting, exercising, or traveling as directed by your health care provider.  Try to have someone help you with your household activities and your newborn for at least a few days after you leave the hospital.  Rest as much as possible. Try to rest or take a nap when your newborn is sleeping.  Increase your activities gradually.  Do not lift more than 15lbs until directed by a provider.  Keep all of your scheduled postpartum appointments. It is very important to keep your scheduled follow-up appointments. At these appointments, your health care provider will be checking to make sure that you are healing physically and emotionally. SEEK MEDICAL CARE IF:   You are passing large clots from your vagina. Save any clots to show your health care provider.  You have a foul smelling discharge from your vagina.  You have trouble urinating.  You are urinating frequently.  You have pain when you urinate.  You have a change in your bowel movements.  You have increasing redness, pain, or swelling near your incision.  You have pus draining from your incision.  Your incision is separating.  You have painful, hard, or reddened breasts.  You have a severe headache.  You have blurred vision or see spots.  You feel sad or depressed.  You have thoughts of hurting yourself or your newborn.  You have questions about your care, the care of your newborn, or medications.  You are dizzy or light-headed.  You have a rash.  You have  pain, redness, or swelling at the site of the removed intravenous access (IV) tube.  You have nausea or vomiting.  You stopped breastfeeding and have not had a menstrual period within 12 weeks of stopping.  You are not breastfeeding and have not had a menstrual period within 12 weeks of delivery.  You have a fever. SEEK IMMEDIATE MEDICAL CARE IF:  You  have persistent pain.  You have chest pain.  You have shortness of breath.  You faint.  You have leg pain.  You have stomach pain.  Your vaginal bleeding saturates 2 or more sanitary pads in 1 hour. MAKE SURE YOU:   Understand these instructions.  Will watch your condition.  Will get help right away if you are not doing well or get worse. Document Released: 01/18/2002 Document Revised: 09/12/2013 Document Reviewed: 12/24/2011 [facility] Patient Information 2015 [place], [place]. This information is not intended to replace advice given to you by your health care provider. Make sure you discuss any questions you have with your health care provider.

## 2018-02-11 NOTE — Clinical Social Work Maternal (Signed)
  CLINICAL SOCIAL WORK MATERNAL/CHILD NOTE  Patient Details  Name: Erin Rangel MRN: [id number] Date of Birth: 1975-07-15  Date:  02/11/2018  Clinical Social Worker Initiating Note:  Shela Leff MSW,LCSW Date/Time: Initiated:  02/11/18/      Child's Name:      Biological Parents:  Mother, Father   Need for Interpreter:  None   Reason for Referral:  Other (Comment)(10 on Depression Scale)   Address:  [place] [address]    Phone number:  [phone] (home)     Additional phone number: none  Household Members/Support Persons (HM/SP):       HM/SP Name Relationship DOB or Age  HM/SP -1        HM/SP -2        HM/SP -3        HM/SP -4        HM/SP -5        HM/SP -6        HM/SP -7        HM/SP -8          Natural Supports (not living in the home):  Extended Family   Professional Supports:     Employment:     Type of Work:     Education:      Homebound arranged:    Museum/gallery curator Resources:  Multimedia programmer   Other Resources:      Cultural/Religious Considerations Which May Impact Care:  none  Strengths:  Ability to meet basic needs , Compliance with medical plan , Home prepared for child    Psychotropic Medications:         Pediatrician:       Pediatrician List:   [place]      Pediatrician Fax Number:    Risk Factors/Current Problems:  None   Cognitive State:  Able to Concentrate , Alert , Goal Oriented    Mood/Affect:  Calm    CSW Assessment: CSW met with patient and her husband this morning regarding patient having a borderline score of 10 on the [place] Depression Scale. Patient was very appropriate and was smiling and laughing with CSW. Patient stated that she and her husband moved to this area from up [place] about 5 months into her pregnancy. She stated that they are also building a house. Patient's current  situation has been very stressful and patient is doing very well in coping at this time. Patient admits to receiving postpartum depression education and patient's husband was able to confirm that he will definitely keep an eye out for any worsening signs and symptoms. Patient has a good support system and they have another child in the home as well.   CSW Plan/Description:  No Further Intervention Required/No Barriers to Discharge    Shela Leff, LCSW 02/11/2018, 10:55 AM

## 2018-02-15 ENCOUNTER — Ambulatory Visit: Payer: BLUE CROSS/BLUE SHIELD | Admitting: Obstetrics and Gynecology

## 2018-02-19 ENCOUNTER — Ambulatory Visit: Payer: BLUE CROSS/BLUE SHIELD | Admitting: Obstetrics and Gynecology

## 2018-02-19 ENCOUNTER — Encounter: Payer: Self-pay | Admitting: Obstetrics and Gynecology

## 2018-02-19 VITALS — BP 128/90 | HR 84 | Wt 151.0 lb

## 2018-02-19 DIAGNOSIS — Z4889 Encounter for other specified surgical aftercare: Secondary | ICD-10-CM

## 2018-02-20 NOTE — Progress Notes (Signed)
Postoperative Follow-up Patient presents post op from RLTCS & BTL 1weeks ago for history of prior cesarean section.  Subjective: Patient reports marked improvement in her preop symptoms. Eating a regular diet without difficulty. The patient is not having any pain.  Activity: normal activities of daily living.  Objective: Blood pressure 128/90, pulse 84, weight 151 lb (68.5 kg), last menstrual period 05/10/2017, currently breastfeeding.  General: NAD Pulmonary: no increased work of breathing Abdomen: soft, non-tender, non-distended, incision D/C/I Extremities: no edema Neurologic: normal gait    Admission on 02/09/2018, Discharged on 02/11/2018  Component Date Value Ref Range Status  . ABO/RH(D) 02/09/2018    Final                   Value:O NEG Performed at [facility], [address] [place], [place] [address]   . SURGICAL PATHOLOGY 02/09/2018    Final                   Value:Surgical Pathology CASE: [id number] PATIENT: Karlissa Wallenstein Surgical Pathology Report     SPECIMEN SUBMITTED: A. Tube segment, right B. Tube segment, left  CLINICAL HISTORY: None provided  PRE-OPERATIVE DIAGNOSIS: History of prior cesarean  POST-OPERATIVE DIAGNOSIS: Same as pre op     DIAGNOSIS: A.  TUBE SEGMENT, RIGHT: - FALLOPIAN TUBE SEGMENT, COMPLETELY TRANSECTED. - BENIGN PARATUBAL CYSTS.  B.  TUBE SEGMENT, LEFT: - FALLOPIAN TUBE SEGMENT, COMPLETELY TRANSECTED.  GROSS DESCRIPTION: A. Labeled: Right tube segment Received: In formalin Size: 1.3 x 0.9 x 0.5 cm Other findings: pink-tan tubular fragment with multiple 0.1 cm paratubal cysts Block summary: 1 - representative cross-sections  B. Labeled: Left tube segment Received: In formalin Size: 0.8 x 0.6 x 0.5 cm Other findings: Pink-tan tubular fragment  Block summary: 1 - representative cross-sections    Final Diagnosis performed by Thomos Lemons, MD.   Electronically signed 10/2              /2019 3:02:30PM The electronic signature indicates that the named Attending Pathologist has evaluated the specimen  Technical component performed at [facility], [address] [place], [place] [address] Lab: [phone] Dir: Jolene Schimke, MD, MMM  Professional component performed at [facility], [address] [place], [place], [place] [address] Lab: [phone] Dir: Georgiann Cocker. Rubinas, MD   . WBC 02/10/2018 12.4* 3.6 - 11.0 K/uL Final  . RBC 02/10/2018 2.32* 3.80 - 5.20 MIL/uL Final  . Hemoglobin 02/10/2018 8.0* 12.0 - 16.0 g/dL Final   RESULT REPEATED AND VERIFIED  . HCT 02/10/2018 22.0* 35.0 - 47.0 % Final  . MCV 02/10/2018 94.9  80.0 - 100.0 fL Final  . MCH 02/10/2018 34.6* 26.0 - 34.0 pg Final  . MCHC 02/10/2018 36.4* 32.0 - 36.0 g/dL Final  . RDW 44/05/270 12.7  11.5 - 14.5 % Final  . Platelets 02/10/2018 191  150 - 440 K/uL Final   Performed at [facility], [address] [place], [place] [address]    Assessment: 42 y.o. s/p RLTCS & BTL stable  Plan: Patient has done well after surgery with no apparent complications.  I have discussed the post-operative course to date, and the expected progress moving forward.  The patient understands what complications to be concerned about.  I will see the patient in routine follow up, or sooner if needed.    Activity plan: No heavy lifting.   Vena Austria, MD, Evern Core [facility], [facility] 02/20/2018, 12:48 AM

## 2018-02-22 ENCOUNTER — Other Ambulatory Visit: Payer: Self-pay | Admitting: Obstetrics and Gynecology

## 2018-02-22 MED ORDER — OXYCODONE-ACETAMINOPHEN 5-325 MG PO TABS: 1.0000 | ORAL_TABLET | ORAL | 0 refills | Status: DC | PRN

## 2018-03-25 ENCOUNTER — Ambulatory Visit (INDEPENDENT_AMBULATORY_CARE_PROVIDER_SITE_OTHER): Payer: BLUE CROSS/BLUE SHIELD | Admitting: Obstetrics and Gynecology

## 2018-03-25 ENCOUNTER — Encounter: Payer: Self-pay | Admitting: Obstetrics and Gynecology

## 2018-03-25 NOTE — Progress Notes (Signed)
Postpartum Visit  Chief Complaint:  Chief Complaint  Patient presents with  . Postpartum Care    C/S 10/1    History of Present Illness: Patient is a 42 y.o. [id number] presents for postpartum visit.  Date of delivery:  Cesarean Section: Elective repeat Pregnancy or labor problems:  no Any problems since the delivery:  no  Newborn Details:  SINGLETON :  1. BabyGender female. Birth weight: 7lbs 7oz Maternal Details:  Breast or formula feeding: plans to breastfeed Contraception after delivery: Yes  s/p BTL Any bowel or bladder issues: No  Post partum depression/anxiety noted:  no Edinburgh Post-Partum Depression Score: 7 Date of last PAP: 01/30/2016 NIL HPV negative ([place])  Review of Systems: Review of Systems  Constitutional: Negative.   Gastrointestinal: Negative.   Genitourinary: Negative.   Psychiatric/Behavioral: Negative for depression. The patient is not nervous/anxious.     The following portions of the patient's history were reviewed and updated as appropriate: allergies, current medications, past family history, past medical history, past social history, past surgical history and problem list.  Past Medical History:  Past Medical History:  Diagnosis Date  . Frequent headaches 2019   DURING PREG    Past Surgical History:  Past Surgical History:  Procedure Laterality Date  . CESAREAN SECTION  01/31/2014   BREECH  . CESAREAN SECTION WITH BILATERAL TUBAL LIGATION N/A 02/09/2018   Procedure: CESAREAN SECTION WITH BILATERAL TUBAL LIGATION;  Surgeon: Vena AustriaStaebler, Undine Nealis, MD;  Location: [facility] ORS;  Service: Obstetrics;  Laterality: N/A;    Family History:  Family History  Problem Relation Age of Onset  . Diabetes Father   . Heart Problems Father   . Hypertension Father   . Heart Problems Paternal Grandmother   . Hypertension Paternal Grandmother   . Heart Problems Paternal Grandfather   . Hypertension Paternal Grandfather   . Heart Problems  Paternal Uncle   . Heart Problems Paternal Uncle   . Heart Problems Paternal Uncle     Social History:  Social History   Socioeconomic History  . Marital status: Married    Spouse name: Not on file  . Number of children: 1  . Years of education: 4916  . Highest education level: Not on file  Occupational History  . Occupation: UNEMPLOYED  Social Needs  . Financial resource strain: Not on file  . Food insecurity:    Worry: Not on file    Inability: Not on file  . Transportation needs:    Medical: Not on file    Non-medical: Not on file  Tobacco Use  . Smoking status: Never Smoker  . Smokeless tobacco: Never Used  Substance and Sexual Activity  . Alcohol use: Not Currently  . Drug use: Never  . Sexual activity: Yes    Birth control/protection: None  Lifestyle  . Physical activity:    Days per week: Not on file    Minutes per session: Not on file  . Stress: Not on file  Relationships  . Social connections:    Talks on phone: Not on file    Gets together: Not on file    Attends religious service: Not on file    Active member of club or organization: Not on file    Attends meetings of clubs or organizations: Not on file    Relationship status: Not on file  . Intimate partner violence:    Fear of current or ex partner: Not on file    Emotionally abused: Not on  file    Physically abused: Not on file    Forced sexual activity: Not on file  Other Topics Concern  . Not on file  Social History Narrative  . Not on file    Allergies:  Allergies  Allergen Reactions  . Adhesive [Tape] Other (See Comments)    Blister    Medications: Prior to Admission medications   Medication Sig Start Date End Date Taking? Authorizing Provider  Prenatal Vit-Fe Fumarate-FA (MULTIVITAMIN-PRENATAL) 27-0.8 MG TABS tablet Take 1 tablet by mouth daily.    Yes [provider]    Physical Exam Blood pressure 124/70, pulse 79, weight 140 lb (63.5 kg), last menstrual period  05/10/2017, currently breastfeeding.  General: NAD HEENT: normocephalic, anicteric Pulmonary: No increased work of breathing Abdomen: NABS, soft, non-tender, non-distended.  Umbilicus without lesions.  No hepatomegaly, splenomegaly or masses palpable. No evidence of hernia. Incision D/C/I Genitourinary:  External: Normal external female genitalia.  Normal urethral meatus, normal  Bartholin's and Skene's glands.    Vagina: Normal vaginal mucosa, no evidence of prolapse.    Cervix: Grossly normal in appearance, no bleeding  Uterus: Non-enlarged, mobile, normal contour.  No CMT  Adnexa: ovaries non-enlarged, no adnexal masses  Rectal: deferred Extremities: no edema, erythema, or tenderness Neurologic: Grossly intact Psychiatric: mood appropriate, affect full  Edinburgh Postnatal Depression Scale - 03/25/18 0951      Edinburgh Postnatal Depression Scale:  In the Past 7 Days   I have been able to laugh and see the funny side of things.  0    I have looked forward with enjoyment to things.  0    I have blamed myself unnecessarily when things went wrong.  2    I have been anxious or worried for no good reason.  1    I have felt scared or panicky for no good reason.  1    Things have been getting on top of me.  0    I have been so unhappy that I have had difficulty sleeping.  1    I have felt sad or miserable.  1    I have been so unhappy that I have been crying.  1    The thought of harming myself has occurred to me.  0    Edinburgh Postnatal Depression Scale Total  7       Assessment: 42 y.o. [id number] presenting for 6 week postpartum visit  Plan: Problem List Items Addressed This Visit    None    Visit Diagnoses    6 weeks postpartum follow-up    -  Primary      1) Contraception - Education given regarding options for contraception, as well as compatibility with breast feeding if applicable.  Patient plans on tubal ligation for contraception.  2)  Pap - ASCCP guidelines and  rational discussed.  ASCCP guidelines and rational discussed.  Patient opts for every 3 years screening interval  3) Patient underwent screening for postpartum depression with no signs of depression  4) Mammogram - discussed 6 months after cessation of breast feeding  5) Return in about 1 year (around 03/26/2019) for annual.   Vena Austria, MD, [facility] 03/25/2018, 1:19 PM

## 2020-03-29 ENCOUNTER — Other Ambulatory Visit: Payer: Self-pay

## 2020-03-29 ENCOUNTER — Ambulatory Visit: Payer: Medicaid Other

## 2020-03-29 ENCOUNTER — Telehealth: Payer: Medicaid Other | Admitting: Nurse Practitioner

## 2020-03-29 DIAGNOSIS — Z20822 Contact with and (suspected) exposure to covid-19: Secondary | ICD-10-CM

## 2020-03-29 LAB — POC COVID19 BINAXNOW: SARS Coronavirus 2 Ag: NEGATIVE

## 2020-03-29 NOTE — Progress Notes (Signed)
   Subjective:    Patient ID: Erin Rangel, female    DOB: [birth year], 44 y.o.   MRN: [id number]  HPI  44 year old female presents with one week of cough sore throat mild headache and some body aches. Denies loss of taste or smell. One episode of diarrhea last night. She has been using Mucinex and is able to mobilize secretions.   Has had COVID-19 vaccine series completion in 9/21.   Denies fever.   Has small children at home one is 82 years old new to daycare has had URI symptoms with cough/runny nose as well. Also had diarrhea two weeks ago.   Patient feels her general URI symptoms are better this week than last, mostly wanted to r/o COVID given new onset diarrhea.    Review of Systems  Constitutional: Negative.   HENT: Positive for congestion, postnasal drip and sore throat.   Respiratory: Positive for cough.   Cardiovascular: Negative.   Gastrointestinal: Positive for diarrhea.  Genitourinary: Negative.   Neurological: Negative.        Objective:   Physical Exam  This was a telehealth appointment performed after nurse visit for rapid COVID-19 testing.   Patient is in no acute distress while assessed over the phone. Able to hold conversation without SOB.   COVID-19 rapid Antigen test negative.      Assessment & Plan:  Symptoms improving today, discussed BRAT diet for diarrhea- push fluids and maintain hydration.  Continue Mucinex for congestion relief.   If URI symptoms worsen f/u with clinic to discuss potential need for antibiotics for secondary infection at that time.

## 2020-09-26 ENCOUNTER — Other Ambulatory Visit (HOSPITAL_COMMUNITY)
Admission: RE | Admit: 2020-09-26 | Discharge: 2020-09-26 | Disposition: A | Discharge status: Home / Self Care | Payer: 59 | Source: Ambulatory Visit | Attending: Obstetrics and Gynecology | Admitting: Obstetrics and Gynecology

## 2020-09-26 ENCOUNTER — Encounter: Payer: Self-pay | Admitting: Obstetrics and Gynecology

## 2020-09-26 ENCOUNTER — Ambulatory Visit (INDEPENDENT_AMBULATORY_CARE_PROVIDER_SITE_OTHER): Payer: 59 | Admitting: Obstetrics and Gynecology

## 2020-09-26 ENCOUNTER — Other Ambulatory Visit: Payer: Self-pay

## 2020-09-26 VITALS — BP 124/82 | HR 61 | Ht 66.0 in | Wt 145.0 lb

## 2020-09-26 DIAGNOSIS — Z124 Encounter for screening for malignant neoplasm of cervix: Secondary | ICD-10-CM | POA: Diagnosis not present

## 2020-09-26 DIAGNOSIS — F411 Generalized anxiety disorder: Secondary | ICD-10-CM

## 2020-09-26 DIAGNOSIS — Z1231 Encounter for screening mammogram for malignant neoplasm of breast: Secondary | ICD-10-CM

## 2020-09-26 DIAGNOSIS — Z01419 Encounter for gynecological examination (general) (routine) without abnormal findings: Secondary | ICD-10-CM | POA: Diagnosis not present

## 2020-09-26 DIAGNOSIS — Z1239 Encounter for other screening for malignant neoplasm of breast: Secondary | ICD-10-CM

## 2020-09-26 DIAGNOSIS — N946 Dysmenorrhea, unspecified: Secondary | ICD-10-CM

## 2020-09-26 MED ORDER — MEFENAMIC ACID 250 MG PO CAPS: ORAL_CAPSULE | ORAL | 11 refills | Status: DC

## 2020-09-26 MED ORDER — HYDROXYZINE HCL 25 MG PO TABS: 25.0000 mg | ORAL_TABLET | Freq: Four times a day (QID) | ORAL | 2 refills | Status: DC | PRN

## 2020-09-26 NOTE — Patient Instructions (Signed)
[facility] [address] [place] [address]  [facility] [place]  [address] [place] [address]  Phone: [phone]  

## 2020-09-26 NOTE — Progress Notes (Signed)
Gynecology Annual Exam  PCP: Patient, No Pcp Per (Inactive)  Chief Complaint:  Chief Complaint  Patient presents with  . Gynecologic Exam    Annual - Discuss menstrual issues, request labs(hormones). RM 5    History of Present Illness: Patient is a 45 y.o. L2G4010 presents for annual exam. The patient has no complaints today.   LMP: Patient's last menstrual period was 09/05/2020. Average Interval: regular monthly Duration of flow: 6 days Heavy Menses: yes first 48-hrs Clots: no Intermenstrual Bleeding: no Postcoital Bleeding: no Dysmenorrhea: yes   The patient is sexually active. She currently uses tubal ligation for contraception. She denies dyspareunia.  The patient does perform self breast exams.  There is no notable family history of breast or ovarian cancer in her family.  The patient wears seatbelts: yes.   The patient has regular exercise: not asked.    The patient reports current symptoms of depression.    Review of Systems: Review of Systems  Constitutional: Negative for chills and fever.  HENT: Negative for congestion.   Respiratory: Negative for cough and shortness of breath.   Cardiovascular: Negative for chest pain and palpitations.  Gastrointestinal: Negative for abdominal pain, constipation, diarrhea, heartburn, nausea and vomiting.  Genitourinary: Negative for dysuria, frequency and urgency.  Skin: Negative for itching and rash.  Neurological: Negative for dizziness and headaches.  Endo/Heme/Allergies: Negative for polydipsia.  Psychiatric/Behavioral: Negative for depression, hallucinations, memory loss, substance abuse and suicidal ideas. The patient is nervous/anxious.     Past Medical History:  Patient Active Problem List   Diagnosis Date Noted  . Postpartum care following cesarean delivery 02/11/2018  . Postpartum anemia 02/11/2018  . Uterine scar from previous cesarean delivery affecting pregnancy 02/09/2018  . S/P cesarean section 02/09/2018   . AMA (advanced maternal age) multigravida 35+, third trimester 11/24/2017    [X]  Growth scan 36 weeks - 7lbs 5oz or 3185g c/w 66/5%ile [X]  APT starting at 36 weeks [X]  Deliver by 40 weeks - C-section 02/09/18   . Supervision of high risk pregnancy, antepartum 11/04/2017    Clinic [facility] Prenatal Labs  Dating LMP=8w Blood type: O negative  Genetic Screen Declined Antibody: negative  Anatomic Normal Rubella: Immune Varicella: Immune  GTT 112 RPR: Non-Reactive  Rhogam 28 wk [X] , PP PRN HBsAg: Negative  TDaP vaccine 12/10/17 Flu Shot: HIV: Negative  Baby Food Breast                               GBS: Negative  Contraception BTL Pap: September 2018/no abnormalities (not in HM)   CBB     CS/VBAC Primary 2015/breech desires repeat   Support Person Husband RJ        . History of cesarean section 11/04/2017    Past Surgical History:  Past Surgical History:  Procedure Laterality Date  . CESAREAN SECTION  01/31/2014   BREECH  . CESAREAN SECTION WITH BILATERAL TUBAL LIGATION N/A 02/09/2018   Procedure: CESAREAN SECTION WITH BILATERAL TUBAL LIGATION;  Surgeon: 01-11-1998, MD;  Location: [facility] ORS;  Service: Obstetrics;  Laterality: N/A;    Gynecologic History:  Patient's last menstrual period was 09/05/2020. Contraception: tubal ligation  Obstetric History: 02/02/2014  Family History:  Family History  Problem Relation Age of Onset  . Diabetes Father   . Heart Problems Father   . Hypertension Father   . Heart Problems Paternal Grandmother   . Hypertension Paternal Grandmother   . Heart  Problems Paternal Grandfather   . Hypertension Paternal Grandfather   . Heart Problems Paternal Uncle   . Heart Problems Paternal Uncle   . Heart Problems Paternal Uncle     Social History:  Social History   Socioeconomic History  . Marital status: Married    Spouse name: Not on file  . Number of children: 1  . Years of education: 4  . Highest education level: Not on file   Occupational History  . Occupation: UNEMPLOYED  Tobacco Use  . Smoking status: Never Smoker  . Smokeless tobacco: Never Used  Vaping Use  . Vaping Use: Never used  Substance and Sexual Activity  . Alcohol use: Yes    Comment: Rare  . Drug use: Never  . Sexual activity: Yes    Birth control/protection: Surgical    Comment: Tubal  Other Topics Concern  . Not on file  Social History Narrative  . Not on file   Social Determinants of Health   Financial Resource Strain: Not on file  Food Insecurity: Not on file  Transportation Needs: Not on file  Physical Activity: Not on file  Stress: Not on file  Social Connections: Not on file  Intimate Partner Violence: Not on file    Allergies:  Allergies  Allergen Reactions  . Adhesive [Tape] Other (See Comments)    Blister    Medications: Prior to Admission medications   Not on File    Physical Exam Vitals: Blood pressure 124/82, pulse 61, height 5\' 6"  (1.676 m), weight 145 lb (65.8 kg), last menstrual period 09/05/2020, currently breastfeeding.  General: NAD HEENT: normocephalic, anicteric Thyroid: no enlargement, no palpable nodules Pulmonary: No increased work of breathing, CTAB Cardiovascular: RRR, distal pulses 2+ Breast: Breast symmetrical, no tenderness, no palpable nodules or masses, no skin or nipple retraction present, no nipple discharge.  No axillary or supraclavicular lymphadenopathy. Abdomen: NABS, soft, non-tender, non-distended.  Umbilicus without lesions.  No hepatomegaly, splenomegaly or masses palpable. No evidence of hernia  Genitourinary:  External: Normal external female genitalia.  Normal urethral meatus, normal Bartholin's and Skene's glands.    Vagina: Normal vaginal mucosa, no evidence of prolapse.    Cervix: Grossly normal in appearance, no bleeding  Uterus: Non-enlarged, mobile, normal contour.  No CMT  Adnexa: ovaries non-enlarged, no adnexal masses  Rectal: deferred  Lymphatic: no evidence of  inguinal lymphadenopathy Extremities: no edema, erythema, or tenderness Neurologic: Grossly intact Psychiatric: mood appropriate, affect full  Female chaperone present for pelvic and breast  portions of the physical exam  Depression screen [facility] 2/9 09/26/2020  Decreased Interest 1  Down, Depressed, Hopeless 1  PHQ - 2 Score 2  Altered sleeping 2  Tired, decreased energy 2  Change in appetite 1  Feeling bad or failure about yourself  2  Trouble concentrating 2  Moving slowly or fidgety/restless 0  Suicidal thoughts 0  PHQ-9 Score 11  Difficult doing work/chores Somewhat difficult    GAD 7 : Generalized Anxiety Score 09/26/2020  Nervous, Anxious, on Edge 2  Control/stop worrying 2  Worry too much - different things 2  Trouble relaxing 2  Restless 2  Easily annoyed or irritable 2  Afraid - awful might happen 0  Total GAD 7 Score 12  Anxiety Difficulty Somewhat difficult       Assessment: 45 y.o. 45 routine annual exam  Plan: Problem List Items Addressed This Visit   None   Visit Diagnoses    Encounter for gynecological examination without abnormal finding    -  Primary   Screening for malignant neoplasm of cervix       Relevant Orders   Cytology - PAP   Breast screening       Breast cancer screening by mammogram       Relevant Orders   MM 3D SCREEN BREAST BILATERAL   Dysmenorrhea       Generalized anxiety disorder       Relevant Medications   hydrOXYzine (ATARAX/VISTARIL) 25 MG tablet      1) Mammogram - recommend yearly screening mammogram.  Mammogram Was ordered today   2) STI screening  was notoffered and therefore not obtained  3) ASCCP guidelines and rational discussed.  Patient opts for every 3 years screening interval  4) Contraception - the patient is currently using  tubal ligation.  She is happy with her current form of contraception and plans to continue - add ponstel for dysmenorrhea   5) Colonoscopy -- Screening recommended starting at  age 86 for average risk individuals, age 31 for individuals deemed at increased risk (including African Americans) and recommended to continue until age 61.  For patient age 4-85 individualized approach is recommended.  Gold standard screening is via colonoscopy, Cologuard screening is an acceptable alternative for patient unwilling or unable to undergo colonoscopy.  "Colorectal cancer screening for average?risk adults: 2018 guideline update from the American Cancer Society"CA: A Cancer Journal for Clinicians: Oct 08, 2016   6) Routine healthcare maintenance including cholesterol, diabetes screening discussed managed by PCP  7) Anxiety - situational component work related as well.  At present will start with prn vistaril but discussed limitation and ability to go to a daily maintenance drug as well  8) Return in about 8 weeks (around 11/21/2020) for medication follow up phone (1 year annual).   Vena Austria, MD, [facility], [facility] 09/26/2020, 8:51 AM

## 2020-09-28 LAB — CYTOLOGY - PAP
Adequacy: ABSENT
Comment: NEGATIVE
Diagnosis: NEGATIVE
High risk HPV: NEGATIVE

## 2020-10-17 ENCOUNTER — Ambulatory Visit
Admission: RE | Admit: 2020-10-17 | Discharge: 2020-10-17 | Disposition: A | Discharge status: Home / Self Care | Payer: 59 | Source: Ambulatory Visit | Attending: Obstetrics and Gynecology | Admitting: Obstetrics and Gynecology

## 2020-10-17 ENCOUNTER — Other Ambulatory Visit: Payer: Self-pay

## 2020-10-17 ENCOUNTER — Inpatient Hospital Stay
Admission: RE | Admit: 2020-10-17 | Discharge: 2020-10-17 | Disposition: A | Discharge status: Home / Self Care | Payer: Self-pay | Source: Ambulatory Visit | Attending: *Deleted | Admitting: *Deleted

## 2020-10-17 ENCOUNTER — Other Ambulatory Visit: Payer: Self-pay | Admitting: *Deleted

## 2020-10-17 DIAGNOSIS — Z1231 Encounter for screening mammogram for malignant neoplasm of breast: Secondary | ICD-10-CM | POA: Insufficient documentation

## 2020-11-27 ENCOUNTER — Ambulatory Visit (INDEPENDENT_AMBULATORY_CARE_PROVIDER_SITE_OTHER): Payer: 59 | Admitting: Obstetrics and Gynecology

## 2020-11-27 ENCOUNTER — Other Ambulatory Visit: Payer: Self-pay

## 2020-11-27 DIAGNOSIS — N946 Dysmenorrhea, unspecified: Secondary | ICD-10-CM

## 2020-11-27 DIAGNOSIS — F411 Generalized anxiety disorder: Secondary | ICD-10-CM | POA: Diagnosis not present

## 2020-11-27 MED ORDER — BUPROPION HCL ER (XL) 150 MG PO TB24: 150.0000 mg | ORAL_TABLET | Freq: Every day | ORAL | 2 refills | Status: DC

## 2020-11-27 NOTE — Progress Notes (Signed)
I connected with Erin Rangel on 11/28/20 at  9:10 AM EDT by telephone and verified that I am speaking with the correct person using two identifiers.   I discussed the limitations, risks, security and privacy concerns of performing an evaluation and management service by telephone and the availability of in person appointments. I also discussed with the patient that there may be a patient responsible charge related to this service. The patient expressed understanding and agreed to proceed.  The patient was at home I spoke with the patient from my workstation phone The names of people involved in this encounter were: Erin Rangel , and Erin Rangel    Obstetrics & Gynecology Office Visit   Chief Complaint:  Chief Complaint  Patient presents with   Follow-up    Phone visit - F/U medication, makes her so sleepy/tired so she is not really taking it, still having anxiety issues.    History of Present Illness: The patient is a 45 y.o. female presenting follow up for symptoms of anxiety.  The patient is currently taking vistaril 25mg  prn for the management of her symptoms.  Reports has not been taking consistently secondary to increased sedation.  She has not had any recent situational stressors beyond the usual life and workplace stressors.  She reports symptoms of anhedonia, day time somnolence, insomnia, irritability, feelings of guilt, and feelings of worthlessness.  She denies risk taking behavior, agorophobia, suicidal ideation, homicidal ideation, auditory hallucinations, and visual hallucinations. Symptoms have remained unchanged since last visit.     Does also report problems concentrating and staying on task as one of her main complaints.    The patient does not have a pre-existing history of depression and anxiety.  She  does not a prior history of suicide attempts.   Review of Systems: Review of Systems  Constitutional: Negative.   Gastrointestinal:  Negative for nausea.   Neurological:  Negative for headaches.  Psychiatric/Behavioral:  Positive for depression. Negative for hallucinations, memory loss, substance abuse and suicidal ideas. The patient is nervous/anxious and has insomnia.     Past Medical History:  Past Medical History:  Diagnosis Date   Frequent headaches 2019   DURING PREG    Past Surgical History:  Past Surgical History:  Procedure Laterality Date   CESAREAN SECTION  01/31/2014   BREECH   CESAREAN SECTION WITH BILATERAL TUBAL LIGATION N/A 02/09/2018   Procedure: CESAREAN SECTION WITH BILATERAL TUBAL LIGATION;  Surgeon: 04/11/2018, MD;  Location: [facility] ORS;  Service: Obstetrics;  Laterality: N/A;    Gynecologic History: No LMP recorded.  Obstetric History: Erin Rangel  Family History:  Family History  Problem Relation Age of Onset   Diabetes Father    Heart Problems Father    Hypertension Father    Heart Problems Paternal Grandmother    Hypertension Paternal Grandmother    Heart Problems Paternal Grandfather    Hypertension Paternal Grandfather    Heart Problems Paternal Uncle    Heart Problems Paternal Uncle    Heart Problems Paternal Uncle     Social History:  Social History   Socioeconomic History   Marital status: Married    Spouse name: Not on file   Number of children: 1   Years of education: 16   Highest education level: Not on file  Occupational History   Occupation: UNEMPLOYED  Tobacco Use   Smoking status: Never   Smokeless tobacco: Never  Vaping Use   Vaping Use: Never used  Substance and Sexual Activity  Alcohol use: Yes    Comment: Rare   Drug use: Never   Sexual activity: Yes    Birth control/protection: Surgical    Comment: Tubal  Other Topics Concern   Not on file  Social History Narrative   Not on file   Social Determinants of Health   Financial Resource Strain: Not on file  Food Insecurity: Not on file  Transportation Needs: Not on file  Physical Activity: Not on file   Stress: Not on file  Social Connections: Not on file  Intimate Partner Violence: Not on file    Allergies:  Allergies  Allergen Reactions   Adhesive [Tape] Other (See Comments)    Blister    Medications: Prior to Admission medications   Medication Sig Start Date End Date Taking? Authorizing Provider  hydrOXYzine (ATARAX/VISTARIL) 25 MG tablet Take 1 tablet (25 mg total) by mouth every 6 (six) hours as needed for anxiety. 09/26/20  Yes Erin Austria, MD  Mefenamic Acid (PONSTEL) 250 MG CAPS 2 capsules (500 mg) po once and the onset of menses, then 1 capsule (250 mg) every 6 hours prn cramping max 3 days 09/26/20  Yes Erin Austria, MD    Physical Exam Vitals: There were no vitals filed for this visit. No LMP recorded.  No physical exam as this was a remote telephone visit to promote social distancing during the current COVID-19 Pandemic   GAD 7 : Generalized Anxiety Score 09/26/2020  Nervous, Anxious, on Edge 2  Control/stop worrying 2  Worry too much - different things 2  Trouble relaxing 2  Restless 2  Easily annoyed or irritable 2  Afraid - awful might happen 0  Total GAD 7 Score 12  Anxiety Difficulty Somewhat difficult    Depression screen PHQ 2/9 09/26/2020  Decreased Interest 1  Down, Depressed, Hopeless 1  PHQ - 2 Score 2  Altered sleeping 2  Tired, decreased energy 2  Change in appetite 1  Feeling bad or failure about yourself  2  Trouble concentrating 2  Moving slowly or fidgety/restless 0  Suicidal thoughts 0  PHQ-9 Score 11  Difficult doing work/chores Somewhat difficult    Depression screen PHQ 2/9 09/26/2020  Decreased Interest 1  Down, Depressed, Hopeless 1  PHQ - 2 Score 2  Altered sleeping 2  Tired, decreased energy 2  Change in appetite 1  Feeling bad or failure about yourself  2  Trouble concentrating 2  Moving slowly or fidgety/restless 0  Suicidal thoughts 0  PHQ-9 Score 11  Difficult doing work/chores Somewhat difficult      Assessment: 45 y.o. [id number] follow up anxiety   Plan: Problem List Items Addressed This Visit   None Visit Diagnoses     Generalized anxiety disorder    -  Primary   Relevant Medications   buPROPion (WELLBUTRIN XL) 150 MG 24 hr tablet   Dysmenorrhea           1) Anxiety/Depression  - found vistaril to sedating.  Discussed may still use for night time symptoms or trouble sleeping - start Wellbutrin XL 150mg  po daily  2) Thyroid and B12 screen has not been obtained previously  3) Ponstel - no significant improvement in dysmenorrhea.  Discussed starting the day prior to menstrual cycle starting if she can tell based on symptoms  4) Telephone time: 11:45 minutes  5) Return in about 2 weeks (around 12/11/2020) for medication follow up phone or in person.    02/10/2021, MD, [facility], [facility] 11/27/2020, 9:26 AM

## 2020-12-10 ENCOUNTER — Other Ambulatory Visit: Payer: Self-pay

## 2020-12-10 ENCOUNTER — Ambulatory Visit (INDEPENDENT_AMBULATORY_CARE_PROVIDER_SITE_OTHER): Payer: 59 | Admitting: Obstetrics and Gynecology

## 2020-12-10 DIAGNOSIS — F411 Generalized anxiety disorder: Secondary | ICD-10-CM

## 2020-12-10 NOTE — Progress Notes (Signed)
I connected with Erin Rangel on 12/19/20 at  1:50 PM EDT by telephone and verified that I am speaking with the correct person using two identifiers.   I discussed the limitations, risks, security and privacy concerns of performing an evaluation and management service by telephone and the availability of in person appointments. I also discussed with the patient that there may be a patient responsible charge related to this service. The patient expressed understanding and agreed to proceed.  The patient was at home I spoke with the patient from my workstation phone The names of people involved in this encounter were: Erin Rangel , and Vena Austria   Obstetrics & Gynecology Office Visit   Chief Complaint:  Chief Complaint  Patient presents with   Follow-up    Phone visit - F/U medication, made her feel weird (numb) a little bit out of it so she stopped taking it.    History of Present Illness: The patient is a 45 y.o. female presenting follow up for symptoms of anxiety.  The patient is currently taking Wellbutrin XL 150mg , and Hydroxizine 25mg  prn for the management of her symptoms.  She has not had any recent situational stressors.  She reports good improvement in symptoms.  She denies anhedonia, risk taking behavior, irritability, social anxiety, agorophobia, feelings of guilt, feelings of worthlessness, suicidal ideation, homicidal ideation, auditory hallucinations, and visual hallucinations. Symptoms have improved since last visit.     She felt that hydroxyzine was sedating and has not used.    Review of Systems: review of systems negative unless noted in HPI  Past Medical History:  Past Medical History:  Diagnosis Date   Frequent headaches 2019   DURING PREG    Past Surgical History:  Past Surgical History:  Procedure Laterality Date   CESAREAN SECTION  01/31/2014   BREECH   CESAREAN SECTION WITH BILATERAL TUBAL LIGATION N/A 02/09/2018   Procedure: CESAREAN SECTION  WITH BILATERAL TUBAL LIGATION;  Surgeon: 02/02/2014, MD;  Location: [facility] ORS;  Service: Obstetrics;  Laterality: N/A;    Gynecologic History: No LMP recorded.  Obstetric History: 04/11/2018  Family History:  Family History  Problem Relation Age of Onset   Diabetes Father    Heart Problems Father    Hypertension Father    Heart Problems Paternal Grandmother    Hypertension Paternal Grandmother    Heart Problems Paternal Grandfather    Hypertension Paternal Grandfather    Heart Problems Paternal Uncle    Heart Problems Paternal Uncle    Heart Problems Paternal Uncle     Social History:  Social History   Socioeconomic History   Marital status: Married    Spouse name: Not on file   Number of children: 1   Years of education: 16   Highest education level: Not on file  Occupational History   Occupation: UNEMPLOYED  Tobacco Use   Smoking status: Never   Smokeless tobacco: Never  Vaping Use   Vaping Use: Never used  Substance and Sexual Activity   Alcohol use: Yes    Comment: Rare   Drug use: Never   Sexual activity: Yes    Birth control/protection: Surgical    Comment: Tubal  Other Topics Concern   Not on file  Social History Narrative   Not on file   Social Determinants of Health   Financial Resource Strain: Not on file  Food Insecurity: Not on file  Transportation Needs: Not on file  Physical Activity: Not on file  Stress: Not  on file  Social Connections: Not on file  Intimate Partner Violence: Not on file    Allergies:  Allergies  Allergen Reactions   Adhesive [Tape] Other (See Comments)    Blister    Medications: Prior to Admission medications   Medication Sig Start Date End Date Taking? Authorizing Provider  Mefenamic Acid (PONSTEL) 250 MG CAPS 2 capsules (500 mg) po once and the onset of menses, then 1 capsule (250 mg) every 6 hours prn cramping max 3 days 09/26/20  Yes Vena Austria, MD  buPROPion (WELLBUTRIN XL) 150 MG 24 hr tablet Take  1 tablet (150 mg total) by mouth daily. 11/27/20   Vena Austria, MD  hydrOXYzine (ATARAX/VISTARIL) 25 MG tablet Take 1 tablet (25 mg total) by mouth every 6 (six) hours as needed for anxiety. 09/26/20   Vena Austria, MD    Physical Exam Vitals: There were no vitals filed for this visit. No LMP recorded.  No physical exam as this was a remote telephone visit to promote social distancing during the current COVID-19 Pandemic   GAD 7 : Generalized Anxiety Score 12/10/2020 09/26/2020  Nervous, Anxious, on Edge 2 2  Control/stop worrying 2 2  Worry too much - different things 1 2  Trouble relaxing 1 2  Restless 1 2  Easily annoyed or irritable 1 2  Afraid - awful might happen 1 0  Total GAD 7 Score 9 12  Anxiety Difficulty Somewhat difficult Somewhat difficult    Depression screen [facility] 2/9 12/10/2020 09/26/2020  Decreased Interest 0 1  Down, Depressed, Hopeless 0 1  PHQ - 2 Score 0 2  Altered sleeping 2 2  Tired, decreased energy 1 2  Change in appetite - 1  Feeling bad or failure about yourself  0 2  Trouble concentrating 2 2  Moving slowly or fidgety/restless 0 0  Suicidal thoughts 0 0  PHQ-9 Score 5 11  Difficult doing work/chores Somewhat difficult Somewhat difficult    Depression screen [facility] 2/9 12/10/2020 09/26/2020  Decreased Interest 0 1  Down, Depressed, Hopeless 0 1  PHQ - 2 Score 0 2  Altered sleeping 2 2  Tired, decreased energy 1 2  Change in appetite - 1  Feeling bad or failure about yourself  0 2  Trouble concentrating 2 2  Moving slowly or fidgety/restless 0 0  Suicidal thoughts 0 0  PHQ-9 Score 5 11  Difficult doing work/chores Somewhat difficult Somewhat difficult   Assessment: 45 y.o. [id number] follow up anxiety and depression  Plan: Problem List Items Addressed This Visit   None Visit Diagnoses     Generalized anxiety disorder    -  Primary       1) Generalized Anxiety - Medication follow up  in 4 weeks continue current dose of  wellbutrin  2) Thyroid and B12 screen has not been obtained previously  3) Telephone Time: 8:52min  4)  Return in about 4 weeks (around 01/07/2021) for Medication follow up phone or in person.    Vena Austria, MD, [facility], [facility]

## 2020-12-19 ENCOUNTER — Other Ambulatory Visit: Payer: Self-pay | Admitting: Obstetrics and Gynecology

## 2021-01-11 ENCOUNTER — Ambulatory Visit: Payer: 59 | Admitting: Obstetrics and Gynecology

## 2021-09-28 IMAGING — MG MM DIGITAL SCREENING BILAT W/ TOMO AND CAD
8 series · 8 of 24 positions shown · non-contrast
Comparison: Previous exam(s).

CLINICAL DATA: Screening.

EXAM:
DIGITAL SCREENING BILATERAL MAMMOGRAM WITH TOMOSYNTHESIS AND CAD
TECHNIQUE: Bilateral screening digital craniocaudal and mediolateral oblique
mammograms were obtained. Bilateral screening digital breast
tomosynthesis was performed. The images were evaluated with
computer-aided detection.

[R MLO synth-2D]
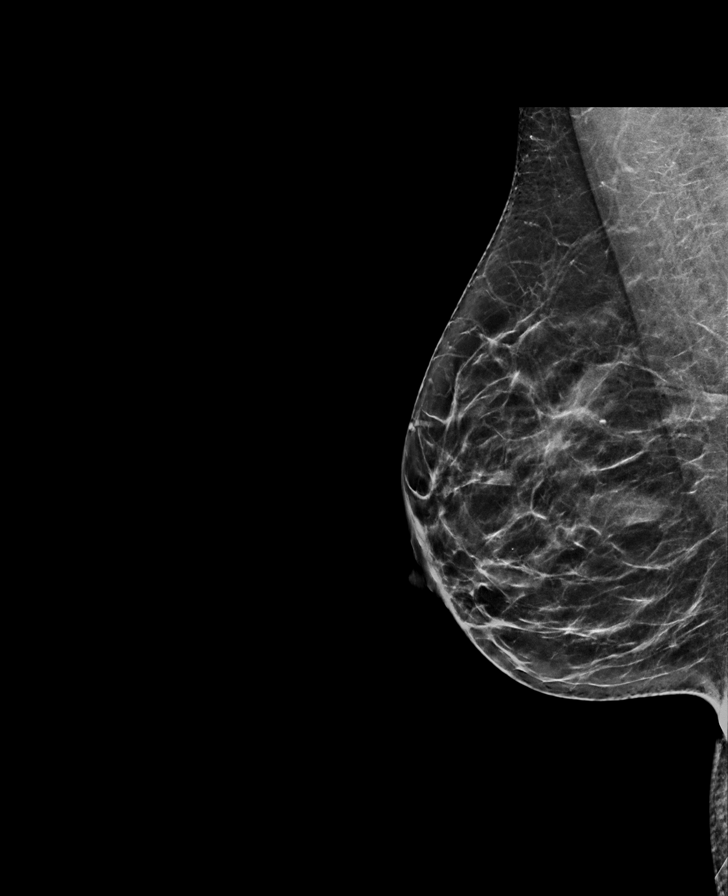

[L CC synth-2D]
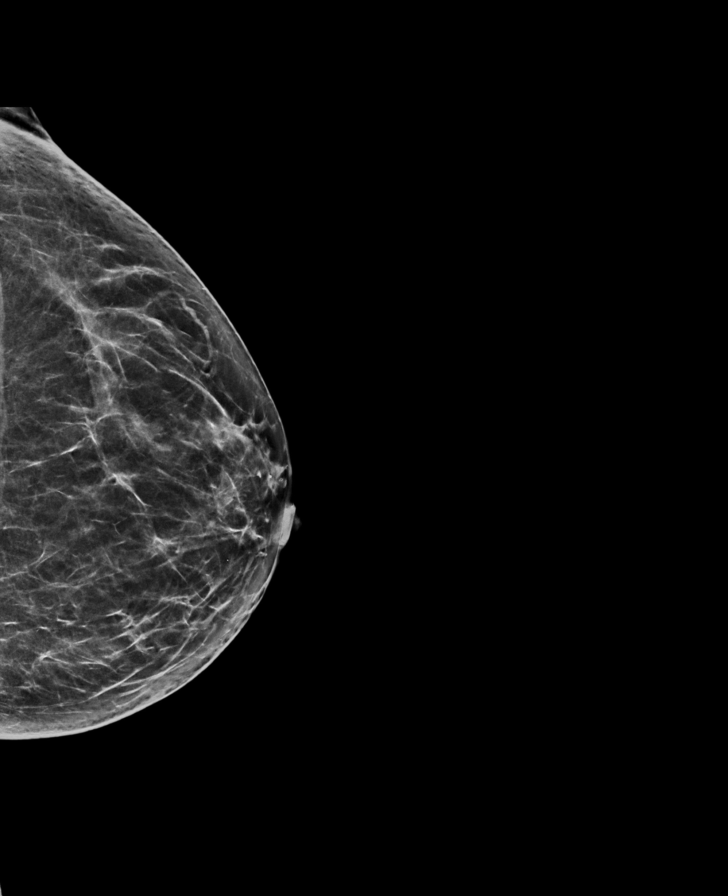

[L MLO synth-2D]
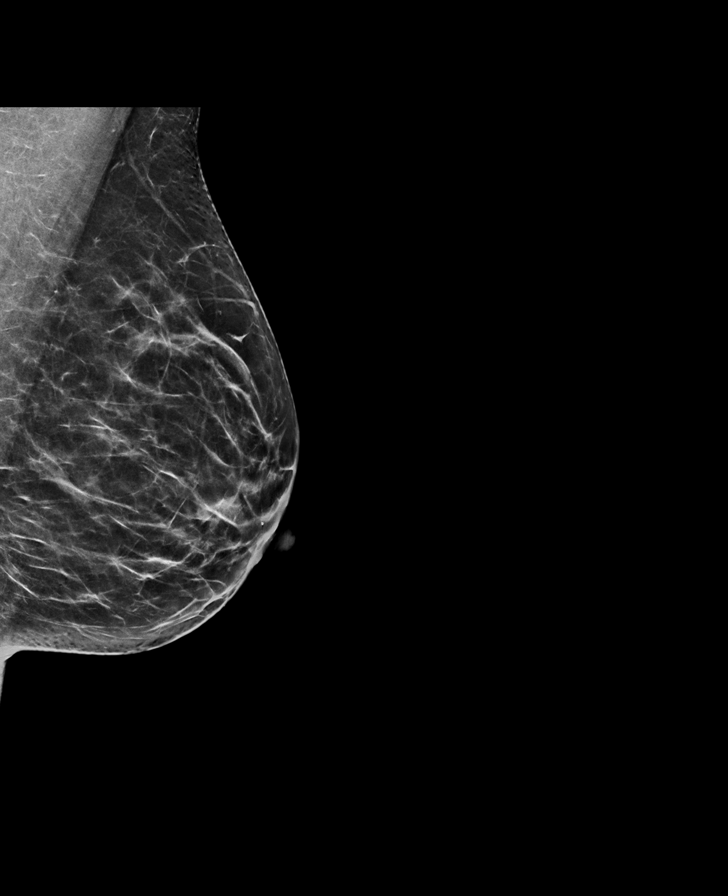

[R CC synth-2D]
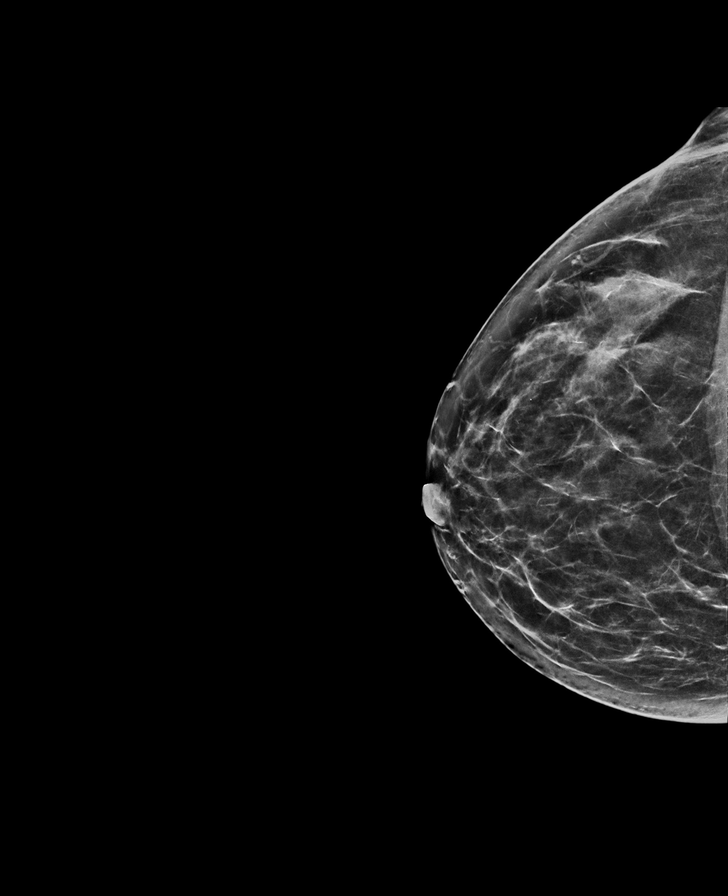

[R MLO tomo · tomo slice 32/63.0]
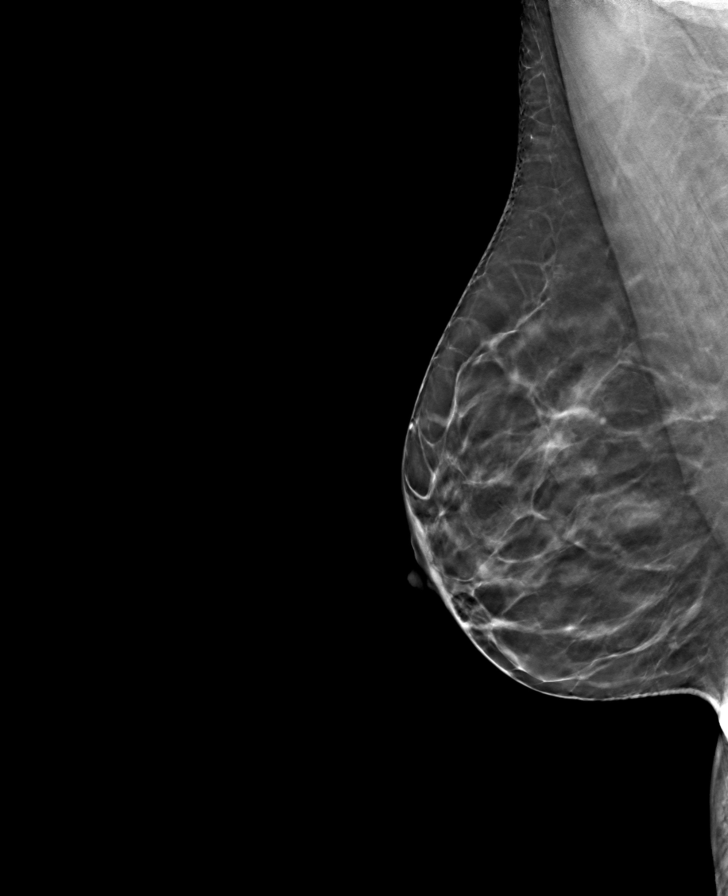

[R CC tomo · tomo slice 32/63.0]
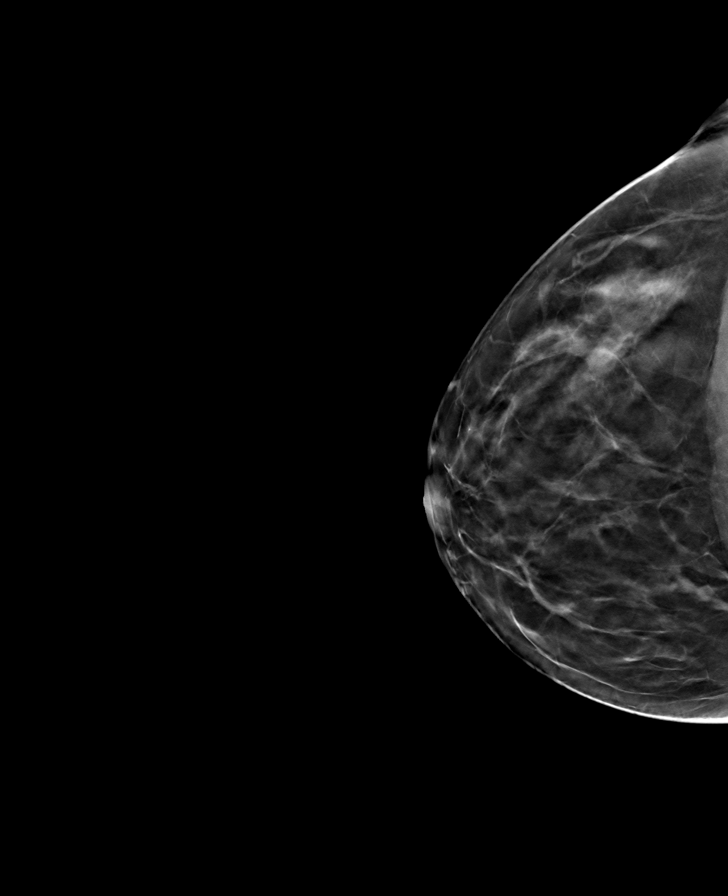

[L MLO tomo · tomo slice 33/66.0]
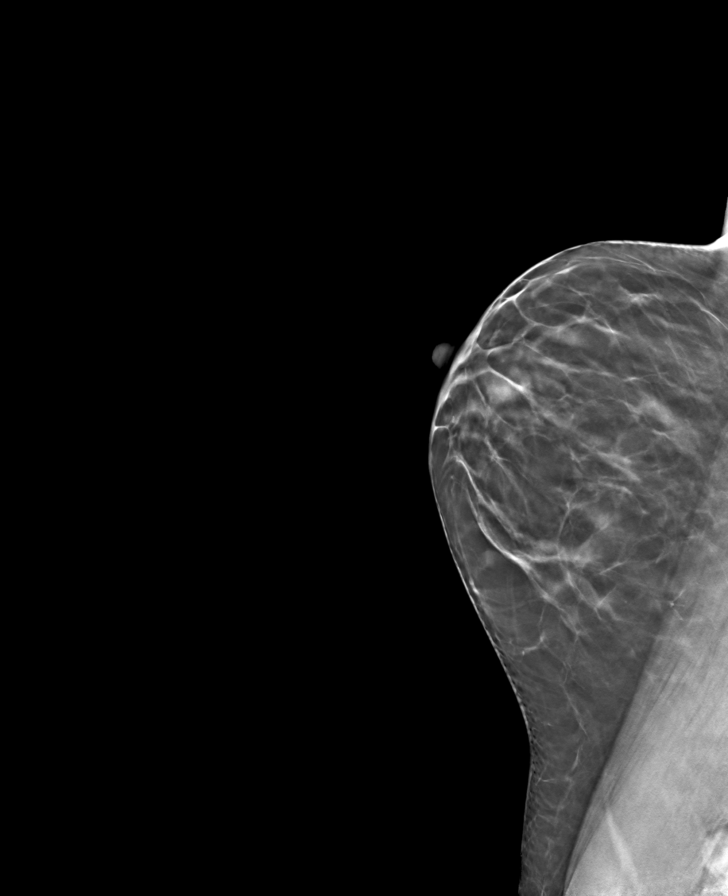

[L CC tomo · tomo slice 31/62.0]
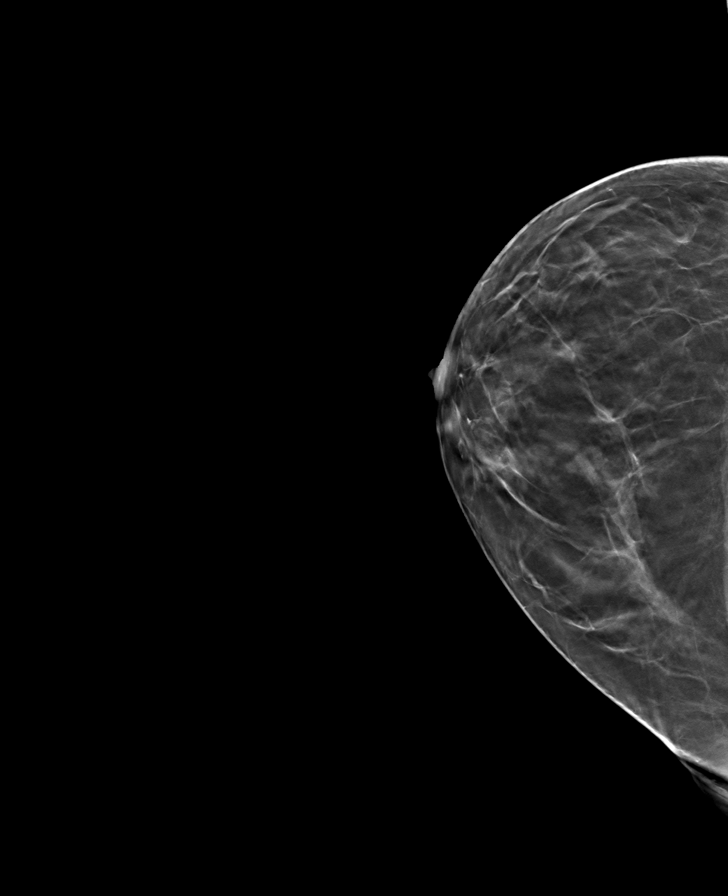

[8 of 24 positions shown; findings below may reference images not displayed]

ACR Breast Density Category b: There are scattered areas of
fibroglandular density.
FINDINGS: There are no findings suspicious for malignancy. The images were
evaluated with computer-aided detection.
IMPRESSION: No mammographic evidence of malignancy. A result letter of this
screening mammogram will be mailed directly to the patient.

RECOMMENDATION:
Screening mammogram in one year. (Code:[id number]-[id number])

BI-RADS CATEGORY  1: Negative.

## 2023-10-28 ENCOUNTER — Ambulatory Visit: Admitting: Family Medicine

## 2023-10-28 VITALS — BP 122/83 | HR 69 | Resp 16 | Ht 65.0 in | Wt 153.9 lb

## 2023-10-28 DIAGNOSIS — Z0001 Encounter for general adult medical examination with abnormal findings: Secondary | ICD-10-CM | POA: Diagnosis not present

## 2023-10-28 DIAGNOSIS — R5382 Chronic fatigue, unspecified: Secondary | ICD-10-CM

## 2023-10-28 DIAGNOSIS — Z1211 Encounter for screening for malignant neoplasm of colon: Secondary | ICD-10-CM

## 2023-10-28 DIAGNOSIS — Z136 Encounter for screening for cardiovascular disorders: Secondary | ICD-10-CM

## 2023-10-28 DIAGNOSIS — N924 Excessive bleeding in the premenopausal period: Secondary | ICD-10-CM

## 2023-10-28 DIAGNOSIS — Z0000 Encounter for general adult medical examination without abnormal findings: Secondary | ICD-10-CM

## 2023-10-28 DIAGNOSIS — Z1231 Encounter for screening mammogram for malignant neoplasm of breast: Secondary | ICD-10-CM

## 2023-10-28 DIAGNOSIS — Z1159 Encounter for screening for other viral diseases: Secondary | ICD-10-CM

## 2023-10-28 DIAGNOSIS — F901 Attention-deficit hyperactivity disorder, predominantly hyperactive type: Secondary | ICD-10-CM

## 2023-10-28 DIAGNOSIS — Z130 Encounter for screening for diseases of the blood and blood-forming organs and certain disorders involving the immune mechanism: Secondary | ICD-10-CM

## 2023-10-28 MED ORDER — AMPHETAMINE-DEXTROAMPHETAMINE 7.5 MG PO TABS: 7.5000 mg | ORAL_TABLET | Freq: Every day | ORAL | 0 refills | Status: DC

## 2023-10-28 NOTE — Patient Instructions (Signed)
 Please call the [facility] [phone]) to schedule a routine screening mammogram.

## 2023-10-28 NOTE — Progress Notes (Signed)
 New patient visit   Patient: Erin Rangel   DOB: 08-Jan-1976   47 y.o. Female  MRN: [id number] Visit Date: 10/28/2023  Today's healthcare provider: LAURAINE LOISE BUOY, DO   Chief Complaint  Patient presents with   Establish Care    Last mammogram 2022 No colonoscopy done before  Previous dr Lake   Fatigue    Possibly low iron. Ongoing since her child birth 2-3 years.    Subjective    Erin Rangel is a 48 y.o. female who presents today as a new patient to establish care.  HPI HPI     Establish Care    Additional comments: Last mammogram 2022 No colonoscopy done before  Previous dr Lake        Fatigue    Additional comments: Possibly low iron. Ongoing since her child birth 2-3 years.       Last edited by Wilfred Hargis RAMAN, CMA on 10/28/2023  9:21 AM.       Erin Rangel is a 48 year old female who presents with chronic fatigue.  She has experienced chronic fatigue since 2019, worsening over the past two to three years. She recalls being informed of low iron levels during a blood donation. She has not been screened for HIV since her pregnancy and is unsure if she was ever screened for hepatitis C.   She underwent a tubal ligation in 2019 and has not used birth control since. She experiences heavy menstrual periods lasting five to six days, with significant blood loss requiring changing pads and tampons every two to three hours during the first two days. She also experiences dysmenorrhea, starting a week before menstruation and requiring rest.  She has a history of anxiety and depression, for which she was prescribed bupropion  and hydroxyzine  in 2022, but discontinued them due to side effects. She is currently taking medication for arthritis and daily vitamins.  She is considering evaluation for ADHD, as she has struggled with focus and organization, which has become more apparent in the past ten years, especially after having children. She describes feeling  overwhelmed and unable to complete tasks, affecting her daily life.  She is seeking referrals for a mammogram, colonoscopy, and dermatology for general skin checks. She has not been to a doctor since her last mammogram in 2022.    Past Medical History:  Diagnosis Date   Arthritis    Knee's   Frequent headaches 2019   DURING PREG   Past Surgical History:  Procedure Laterality Date   CESAREAN SECTION  01/31/2014   BREECH   CESAREAN SECTION WITH BILATERAL TUBAL LIGATION N/A 02/09/2018   Procedure: CESAREAN SECTION WITH BILATERAL TUBAL LIGATION;  Surgeon: Lake Read, MD;  Location: [facility] ORS;  Service: Obstetrics;  Laterality: N/A;   Family Status  Relation Name Status   Father  (Not Specified)   PGM  (Not Specified)   PGF  (Not Specified)   Bruna Brigham  (Not Specified)   Bruna Brigham  (Not Specified)   Bruna Brigham  (Not Specified)  No partnership data on file   Family History  Problem Relation Age of Onset   Diabetes Father    Heart Problems Father    Hypertension Father    Heart Problems Paternal Grandmother    Hypertension Paternal Grandmother    Heart Problems Paternal Grandfather    Hypertension Paternal Grandfather    Heart Problems Paternal Uncle    Heart Problems Paternal Uncle    Heart Problems Paternal Uncle  Social History   Socioeconomic History   Marital status: Married    Spouse name: Not on file   Number of children: 1   Years of education: 16   Highest education level: Bachelor's degree (e.g., BA, AB, BS)  Occupational History   Occupation: UNEMPLOYED  Tobacco Use   Smoking status: Never   Smokeless tobacco: Never  Vaping Use   Vaping status: Never Used  Substance and Sexual Activity   Alcohol use: Yes    Comment: Rare   Drug use: Never   Sexual activity: Yes    Birth control/protection: Surgical    Comment: Tubal  Other Topics Concern   Not on file  Social History Narrative   Not on file   Social Drivers of Health   Financial  Resource Strain: Low Risk  (10/28/2023)   Overall Financial Resource Strain (CARDIA)    Difficulty of Paying Living Expenses: Not hard at all  Food Insecurity: No Food Insecurity (10/28/2023)   Hunger Vital Sign    Worried About Running Out of Food in the Last Year: Never true    Ran Out of Food in the Last Year: Never true  Transportation Needs: No Transportation Needs (10/28/2023)   PRAPARE - Administrator, Civil Service (Medical): No    Lack of Transportation (Non-Medical): No  Physical Activity: Insufficiently Active (10/28/2023)   Exercise Vital Sign    Days of Exercise per Week: 2 days    Minutes of Exercise per Session: 30 min  Stress: Stress Concern Present (10/28/2023)   [employer] of Occupational Health - Occupational Stress Questionnaire    Feeling of Stress: To some extent  Social Connections: Socially Integrated (10/28/2023)   Social Connection and Isolation Panel    Frequency of Communication with Friends and Family: More than three times a week    Frequency of Social Gatherings with Friends and Family: More than three times a week    Attends Religious Services: More than 4 times per year    Active Member of Clubs or Organizations: Yes    Attends Engineer, structural: More than 4 times per year    Marital Status: Married   Outpatient Medications Prior to Visit  Medication Sig   meloxicam (MOBIC) 15 MG tablet Take 15 mg by mouth daily.   [DISCONTINUED] buPROPion  (WELLBUTRIN  XL) 150 MG 24 hr tablet TAKE 1 TABLET BY MOUTH EVERY DAY   [DISCONTINUED] hydrOXYzine  (ATARAX /VISTARIL ) 25 MG tablet Take 1 tablet (25 mg total) by mouth every 6 (six) hours as needed for anxiety.   [DISCONTINUED] Mefenamic Acid  (PONSTEL ) 250 MG CAPS 2 capsules (500 mg) po once and the onset of menses, then 1 capsule (250 mg) every 6 hours prn cramping max 3 days   No facility-administered medications prior to visit.   Allergies  Allergen Reactions   Adhesive [Tape] Other  (See Comments)    Blister    Immunization History  Administered Date(s) Administered   Influenza Inj Mdck Quad Pf 01/30/2016, 07/29/2017   Influenza,inj,Quad PF,6+ Mos 01/30/2016, 07/29/2017, 12/24/2017   Moderna SARS-COV2 Booster Vaccination 08/15/2020   Moderna Sars-Covid-2 Vaccination 01/31/2020   Rho (D) Immune Globulin  11/22/2013   Tdap 12/15/2013, 12/10/2017   Unspecified SARS-COV-2 Vaccination 01/02/2020    Health Maintenance  Topic Date Due   Hepatitis B Vaccines (1 of 3 - 19+ 3-dose series) Never done   Colonoscopy  Never done   COVID-19 Vaccine (4 - 2024-25 season) 02/10/2024 (Originally 01/11/2023)   HPV VACCINES (1 -  3-dose SCDM series) 10/27/2024 (Originally 01/07/2005)   INFLUENZA VACCINE  12/11/2023   Cervical Cancer Screening (HPV/Pap Cotest)  09/26/2025   DTaP/Tdap/Td (3 - Td or Tdap) 12/11/2027   Hepatitis C Screening  Completed   HIV Screening  Completed   Meningococcal B Vaccine  Aged Out    Patient Care Team: Jester Klingberg, Lauraine SAILOR, DO as PCP - General (Family Medicine)  Review of Systems  Constitutional:  Positive for fatigue. Negative for chills and fever.  HENT:  Negative for congestion, ear pain, rhinorrhea, sneezing and sore throat.   Eyes: Negative.  Negative for pain and redness.  Respiratory:  Negative for cough, shortness of breath and wheezing.   Cardiovascular:  Negative for chest pain and leg swelling.  Gastrointestinal:  Negative for abdominal pain, blood in stool, constipation, diarrhea and nausea.  Endocrine: Negative for polydipsia and polyphagia.  Genitourinary: Negative.  Negative for dysuria, flank pain, hematuria, pelvic pain, vaginal bleeding and vaginal discharge.  Musculoskeletal:  Negative for arthralgias, back pain, gait problem and joint swelling.  Skin:  Negative for rash.  Neurological: Negative.  Negative for dizziness, tremors, seizures, weakness, light-headedness, numbness and headaches.  Hematological:  Negative for adenopathy.   Psychiatric/Behavioral: Negative.  Negative for behavioral problems, confusion and dysphoric mood. The patient is not nervous/anxious and is not hyperactive.         Objective    BP 122/83 (BP Location: Right Arm, Patient Position: Sitting, Cuff Size: Normal)   Pulse 69   Resp 16   Ht 5' 5 (1.651 m)   Wt 153 lb 14.4 oz (69.8 kg)   LMP 09/24/2023   SpO2 99%   Breastfeeding No   BMI 25.61 kg/m     Physical Exam Vitals and nursing note reviewed.  Constitutional:      General: She is awake.     Appearance: Normal appearance.  HENT:     Head: Normocephalic and atraumatic.     Right Ear: Tympanic membrane, ear canal and external ear normal.     Left Ear: Tympanic membrane, ear canal and external ear normal.     Nose: Nose normal.     Mouth/Throat:     Mouth: Mucous membranes are moist.     Pharynx: Oropharynx is clear. No oropharyngeal exudate or posterior oropharyngeal erythema.   Eyes:     General: No scleral icterus.    Extraocular Movements: Extraocular movements intact.     Conjunctiva/sclera: Conjunctivae normal.     Pupils: Pupils are equal, round, and reactive to light.   Neck:     Thyroid: No thyromegaly or thyroid tenderness.   Cardiovascular:     Rate and Rhythm: Normal rate and regular rhythm.     Pulses: Normal pulses.     Heart sounds: Normal heart sounds.  Pulmonary:     Effort: Pulmonary effort is normal. No tachypnea, bradypnea or respiratory distress.     Breath sounds: Normal breath sounds. No stridor. No wheezing, rhonchi or rales.  Abdominal:     General: Bowel sounds are normal. There is no distension.     Palpations: Abdomen is soft. There is no mass.     Tenderness: There is no abdominal tenderness. There is no guarding.     Hernia: No hernia is present.   Musculoskeletal:     Cervical back: Normal range of motion and neck supple.     Right lower leg: No edema.     Left lower leg: No edema.  Lymphadenopathy:  Cervical: No cervical  adenopathy.   Skin:    General: Skin is warm and dry.   Neurological:     Mental Status: She is alert and oriented to person, place, and time. Mental status is at baseline.   Psychiatric:        Mood and Affect: Mood normal.        Behavior: Behavior normal.     Depression Screen    10/28/2023    9:42 AM 10/28/2023    9:18 AM 12/10/2020    1:50 PM 09/26/2020    8:51 AM  PHQ 2/9 Scores  PHQ - 2 Score 0 0 0 2  PHQ- 9 Score 7  5 11    Results for orders placed or performed in visit on 10/28/23  Iron, TIBC and Ferritin Panel  Result Value Ref Range   Total Iron Binding Capacity 426 250 - 450 ug/dL   UIBC 675 868 - 574 ug/dL   Iron 897 27 - 840 ug/dL   Iron Saturation 24 15 - 55 %   Ferritin 12 (L) 15 - 150 ng/mL  CBC  Result Value Ref Range   WBC 7.4 3.4 - 10.8 x10E3/uL   RBC 4.18 3.77 - 5.28 x10E6/uL   Hemoglobin 12.6 11.1 - 15.9 g/dL   Hematocrit 61.4 65.9 - 46.6 %   MCV 92 79 - 97 fL   MCH 30.1 26.6 - 33.0 pg   MCHC 32.7 31.5 - 35.7 g/dL   RDW 86.1 88.2 - 84.5 %   Platelets 352 150 - 450 x10E3/uL  Comprehensive metabolic panel with GFR  Result Value Ref Range   Glucose 87 70 - 99 mg/dL   BUN 13 6 - 24 mg/dL   Creatinine, Ser 9.25 0.57 - 1.00 mg/dL   eGFR 897 >40 fO/fpw/8.26   BUN/Creatinine Ratio 18 9 - 23   Sodium 137 134 - 144 mmol/L   Potassium 4.3 3.5 - 5.2 mmol/L   Chloride 102 96 - 106 mmol/L   CO2 21 20 - 29 mmol/L   Calcium 9.4 8.7 - 10.2 mg/dL   Total Protein 7.0 6.0 - 8.5 g/dL   Albumin 4.8 3.9 - 4.9 g/dL   Globulin, Total 2.2 1.5 - 4.5 g/dL   Bilirubin Total 0.4 0.0 - 1.2 mg/dL   Alkaline Phosphatase 61 44 - 121 IU/L   AST 35 0 - 40 IU/L   ALT 42 (H) 0 - 32 IU/L  Lipid panel  Result Value Ref Range   Cholesterol, Total 246 (H) 100 - 199 mg/dL   Triglycerides 857 0 - 149 mg/dL   HDL 55 >60 mg/dL   VLDL Cholesterol Cal 26 5 - 40 mg/dL   LDL Chol Calc (NIH) 834 (H) 0 - 99 mg/dL   Chol/HDL Ratio 4.5 (H) 0.0 - 4.4 ratio  VITAMIN D  25 Hydroxy  (Vit-D Deficiency, Fractures)  Result Value Ref Range   Vit D, 25-Hydroxy 43.2 30.0 - 100.0 ng/mL  TSH Rfx on Abnormal to Free T4  Result Value Ref Range   TSH 2.330 0.450 - 4.500 uIU/mL  HCV Ab w Reflex to Quant PCR  Result Value Ref Range   HCV Ab Non Reactive Non Reactive  Vitamin B12  Result Value Ref Range   Vitamin B-12 791 232 - 1,245 pg/mL  Interpretation:  Result Value Ref Range   HCV Interp 1: Comment     Assessment & Plan     Encounter for medical examination to establish care -  Ambulatory referral to Dermatology  Chronic fatigue -     Iron, TIBC and Ferritin Panel -     CBC -     VITAMIN D  25 Hydroxy (Vit-D Deficiency, Fractures) -     TSH Rfx on Abnormal to Free T4 -     Vitamin B12  Excessive bleeding in premenopausal period  Attention deficit hyperactivity disorder (ADHD), predominantly hyperactive type -     Amphetamine -Dextroamphetamine ; Take 1 tablet by mouth daily.  Dispense: 14 tablet; Refill: 0  Encounter for screening for cardiovascular disorders -     Lipid panel  Screening for endocrine, metabolic and immunity disorder -     Comprehensive metabolic panel with GFR  Encounter for hepatitis C screening test for low risk patient -     HCV Ab w Reflex to Quant PCR  Encounter for screening mammogram for breast cancer -     3D Screening Mammogram, Left and Right; Future  Encounter for colorectal cancer screening -     Ambulatory referral to Gastroenterology  Other orders -     Interpretation:     Encounter for examination to establish care Physical exam overall unremarkable except as noted above. Routine lab work ordered as noted. Behind on routine screenings. Discussed need for mammogram, colonoscopy, dermatology referral, HPV vaccination eligibility, and COVID booster availability. - Order mammogram. - Refer for colonoscopy. - Refer to [facility] for skin check. - Discuss HPV vaccination eligibility. - Inform about COVID  booster availability at pharmacies.  Chronic Fatigue Chronic fatigue possibly linked to low iron levels. Differential includes vitamin deficiencies and thyroid dysfunction. - Order CMP and lipid panel. - Order vitamin D  and B12 levels. - Order thyroid function tests. - Screen for hepatitis C.  Menorrhagia Heavy menstrual bleeding post-tubal ligation. Discussed birth control and IUD as treatment options. IUD may stop periods but can cause initial cramping and irregular bleeding. Insurance coverage needs verification. - Refer to OB-GYN for evaluation and management. - Discuss birth control options with OB-GYN. - Consider IUD for management of heavy periods.  Attention-Deficit/Hyperactivity Disorder (ADHD) Symptoms include difficulty focusing, feeling overwhelmed, and hyperactivity. Discussed trial of stimulant medication with potential side effects. Immediate release formulation chosen for monitoring and adjustment. - Prescribe Adderall immediate release 7.5 mg with breakfast. - Monitor for side effects such as increased blood pressure and palpitations. - Consider neurodiagnostic testing if needed.     Return in about 4 weeks (around 11/25/2023) for ADHD.     I discussed the assessment and treatment plan with the patient  The patient was provided an opportunity to ask questions and all were answered. The patient agreed with the plan and demonstrated an understanding of the instructions.   The patient was advised to call back or seek an in-person evaluation if the symptoms worsen or if the condition fails to improve as anticipated.    LAURAINE LOISE BUOY, DO  [facility] [phone] (phone) [phone] (fax)  [facility]

## 2023-10-29 LAB — CBC
Hematocrit: 38.5 % (ref 34.0–46.6)
Hemoglobin: 12.6 g/dL (ref 11.1–15.9)
MCH: 30.1 pg (ref 26.6–33.0)
MCHC: 32.7 g/dL (ref 31.5–35.7)
MCV: 92 fL (ref 79–97)
Platelets: 352 10*3/uL (ref 150–450)
RBC: 4.18 x10E6/uL (ref 3.77–5.28)
RDW: 13.8 % (ref 11.7–15.4)
WBC: 7.4 10*3/uL (ref 3.4–10.8)

## 2023-10-29 LAB — LIPID PANEL
Chol/HDL Ratio: 4.5 ratio — ABNORMAL HIGH (ref 0.0–4.4)
Cholesterol, Total: 246 mg/dL — ABNORMAL HIGH (ref 100–199)
HDL: 55 mg/dL (ref >39)
LDL Chol Calc (NIH): 165 mg/dL — ABNORMAL HIGH (ref 0–99)
Triglycerides: 142 mg/dL (ref 0–149)
VLDL Cholesterol Cal: 26 mg/dL (ref 5–40)

## 2023-10-29 LAB — COMPREHENSIVE METABOLIC PANEL WITH GFR
ALT: 42 IU/L — ABNORMAL HIGH (ref 0–32)
AST: 35 IU/L (ref 0–40)
Albumin: 4.8 g/dL (ref 3.9–4.9)
Alkaline Phosphatase: 61 IU/L (ref 44–121)
BUN/Creatinine Ratio: 18 (ref 9–23)
BUN: 13 mg/dL (ref 6–24)
Bilirubin Total: 0.4 mg/dL (ref 0.0–1.2)
CO2: 21 mmol/L (ref 20–29)
Calcium: 9.4 mg/dL (ref 8.7–10.2)
Chloride: 102 mmol/L (ref 96–106)
Creatinine, Ser: 0.74 mg/dL (ref 0.57–1.00)
Globulin, Total: 2.2 g/dL (ref 1.5–4.5)
Glucose: 87 mg/dL (ref 70–99)
Potassium: 4.3 mmol/L (ref 3.5–5.2)
Sodium: 137 mmol/L (ref 134–144)
Total Protein: 7 g/dL (ref 6.0–8.5)
eGFR: 102 mL/min/{1.73_m2} (ref >59)

## 2023-10-29 LAB — HCV AB W REFLEX TO QUANT PCR: HCV Ab: NONREACTIVE

## 2023-10-29 LAB — IRON,TIBC AND FERRITIN PANEL
Ferritin: 12 ng/mL — ABNORMAL LOW (ref 15–150)
Iron Saturation: 24 % (ref 15–55)
Iron: 102 ug/dL (ref 27–159)
Total Iron Binding Capacity: 426 ug/dL (ref 250–450)
UIBC: 324 ug/dL (ref 131–425)

## 2023-10-29 LAB — VITAMIN D 25 HYDROXY (VIT D DEFICIENCY, FRACTURES): Vit D, 25-Hydroxy: 43.2 ng/mL (ref 30.0–100.0)

## 2023-10-29 LAB — VITAMIN B12: Vitamin B-12: 791 pg/mL (ref 232–1245)

## 2023-10-29 LAB — HCV INTERPRETATION

## 2023-10-29 LAB — TSH RFX ON ABNORMAL TO FREE T4: TSH: 2.33 u[IU]/mL (ref 0.450–4.500)

## 2023-11-06 ENCOUNTER — Ambulatory Visit: Payer: Self-pay | Admitting: Family Medicine

## 2023-11-06 DIAGNOSIS — F901 Attention-deficit hyperactivity disorder, predominantly hyperactive type: Secondary | ICD-10-CM

## 2023-11-16 ENCOUNTER — Other Ambulatory Visit: Payer: Self-pay

## 2023-11-16 ENCOUNTER — Telehealth: Payer: Self-pay

## 2023-11-16 DIAGNOSIS — Z1211 Encounter for screening for malignant neoplasm of colon: Secondary | ICD-10-CM

## 2023-11-16 MED ORDER — NA SULFATE-K SULFATE-MG SULF 17.5-3.13-1.6 GM/177ML PO SOLN: 1.0000 | Freq: Once | ORAL | 0 refills | Status: AC

## 2023-11-16 NOTE — Telephone Encounter (Signed)
 Gastroenterology Pre-Procedure Review  Request Date: 03/03/24 Requesting Physician: Dr. Jinny  PATIENT REVIEW QUESTIONS: The patient responded to the following health history questions as indicated:    1. Are you having any GI issues? no 2. Do you have a personal history of Polyps? no 3. Do you have a family history of Colon Cancer or Polyps? no 4. Diabetes Mellitus? no 5. Joint replacements in the past 12 months?no 6. Major health problems in the past 3 months?no 7. Any artificial heart valves, MVP, or defibrillator?no    MEDICATIONS & ALLERGIES:    Patient reports the following regarding taking any anticoagulation/antiplatelet therapy:   Plavix, Coumadin, Eliquis, Xarelto, Lovenox, Pradaxa, Brilinta, or Effient? no Aspirin? no  Patient confirms/reports the following medications:  Current Outpatient Medications  Medication Sig Dispense Refill   amphetamine -dextroamphetamine  (ADDERALL) 7.5 MG tablet Take 1 tablet by mouth daily. 14 tablet 0   meloxicam (MOBIC) 15 MG tablet Take 15 mg by mouth daily.     No current facility-administered medications for this visit.    Patient confirms/reports the following allergies:  Allergies  Allergen Reactions   Adhesive [Tape] Other (See Comments)    Blister    No orders of the defined types were placed in this encounter.   AUTHORIZATION INFORMATION Primary Insurance: 1D#: Group #:  Secondary Insurance: 1D#: Group #:  SCHEDULE INFORMATION: Date: 03/03/24 Time: Location: [facility]

## 2023-11-18 MED ORDER — AMPHETAMINE-DEXTROAMPHETAMINE 7.5 MG PO TABS
7.5000 mg | ORAL_TABLET | Freq: Every day | ORAL | 0 refills | Status: DC
Start: 2023-11-18 — End: 2023-12-07

## 2023-11-19 ENCOUNTER — Encounter: Payer: Self-pay | Admitting: Family Medicine

## 2023-11-19 ENCOUNTER — Ambulatory Visit: Admitting: Obstetrics and Gynecology

## 2023-11-19 ENCOUNTER — Encounter: Payer: Self-pay | Admitting: Obstetrics and Gynecology

## 2023-11-19 ENCOUNTER — Other Ambulatory Visit (HOSPITAL_COMMUNITY)
Admission: RE | Admit: 2023-11-19 | Discharge: 2023-11-19 | Disposition: A | Discharge status: Home / Self Care | Source: Ambulatory Visit | Attending: Obstetrics and Gynecology | Admitting: Obstetrics and Gynecology

## 2023-11-19 VITALS — BP 119/76 | HR 71 | Ht 65.0 in | Wt 150.0 lb

## 2023-11-19 DIAGNOSIS — Z1272 Encounter for screening for malignant neoplasm of vagina: Secondary | ICD-10-CM | POA: Diagnosis not present

## 2023-11-19 DIAGNOSIS — Z1211 Encounter for screening for malignant neoplasm of colon: Secondary | ICD-10-CM

## 2023-11-19 DIAGNOSIS — Z1231 Encounter for screening mammogram for malignant neoplasm of breast: Secondary | ICD-10-CM

## 2023-11-19 DIAGNOSIS — Z1151 Encounter for screening for human papillomavirus (HPV): Secondary | ICD-10-CM

## 2023-11-19 DIAGNOSIS — Z01419 Encounter for gynecological examination (general) (routine) without abnormal findings: Secondary | ICD-10-CM

## 2023-11-19 DIAGNOSIS — Z124 Encounter for screening for malignant neoplasm of cervix: Secondary | ICD-10-CM | POA: Diagnosis present

## 2023-11-19 NOTE — Progress Notes (Signed)
 PCP: Donzella Lauraine SAILOR, DO   Chief Complaint  Patient presents with   Annual Exam    HPI:      [name] is a 48 y.o. [id number] whose LMP was Patient's last menstrual period was 11/11/2023., presents today for her annual examination.  Her menses are regular every 28-30 days, lasting 6-7 days, mod to heavy flow, with small clots, no BTB, mild dysmen, improved with NSAIDs. Menses heavier since BTL. WNL CBC 6/25 with PCP but had low ferritin, normal iron. Has started Fe supp. Has occas VS sx.   Sex activity: single partner, contraception - tubal ligation. She does not have vaginal dryness.  Last Pap: 09/26/20 Results were: no abnormalities /neg HPV DNA.   Last mammogram: 10/17/20  Results were: normal--routine follow-up in 12 months There is no FH of breast cancer. There is no FH of ovarian cancer. The patient does do self-breast exams.  Colonoscopy: never, has colonoscopy scheduled 10/25  Tobacco use: The patient denies current or previous tobacco use. Alcohol use: none No drug use Exercise: min active  She does get adequate calcium but not Vitamin D  in her diet.  Labs with PCP.   Patient Active Problem List   Diagnosis Date Noted   Postpartum anemia 02/11/2018   History of cesarean section 11/04/2017    Past Surgical History:  Procedure Laterality Date   CESAREAN SECTION  01/31/2014   [facility]   CESAREAN SECTION WITH BILATERAL TUBAL LIGATION N/A 02/09/2018   Procedure: CESAREAN SECTION WITH BILATERAL TUBAL LIGATION;  Surgeon: Lake Read, MD;  Location: [facility] ORS;  Service: Obstetrics;  Laterality: N/A;    Family History  Problem Relation Age of Onset   Diabetes Father    Heart Problems Father    Hypertension Father    Heart Problems Paternal Grandmother    Hypertension Paternal Grandmother    Heart Problems Paternal Grandfather    Hypertension Paternal Grandfather    Heart Problems Paternal Uncle    Heart Problems Paternal Uncle    Heart Problems Paternal  Uncle     Social History   Socioeconomic History   Marital status: Married    Spouse name: Not on file   Number of children: 1   Years of education: 16   Highest education level: Bachelor's degree (e.g., BA, AB, BS)  Occupational History   Occupation: UNEMPLOYED  Tobacco Use   Smoking status: Never   Smokeless tobacco: Never  Vaping Use   Vaping status: Never Used  Substance and Sexual Activity   Alcohol use: Yes    Comment: Rare   Drug use: Never   Sexual activity: Yes    Birth control/protection: Surgical    Comment: Tubal  Other Topics Concern   Not on file  Social History Narrative   Not on file   Social Drivers of Health   Financial Resource Strain: Low Risk  (10/28/2023)   Overall Financial Resource Strain (CARDIA)    Difficulty of Paying Living Expenses: Not hard at all  Food Insecurity: No Food Insecurity (10/28/2023)   Hunger Vital Sign    Worried About Running Out of Food in the Last Year: Never true    Ran Out of Food in the Last Year: Never true  Transportation Needs: No Transportation Needs (10/28/2023)   PRAPARE - Administrator, Civil Service (Medical): No    Lack of Transportation (Non-Medical): No  Physical Activity: Insufficiently Active (10/28/2023)   Exercise Vital Sign    Days of Exercise  per Week: 2 days    Minutes of Exercise per Session: 30 min  Stress: Stress Concern Present (10/28/2023)   [employer] of Occupational Health - Occupational Stress Questionnaire    Feeling of Stress: To some extent  Social Connections: Socially Integrated (10/28/2023)   Social Connection and Isolation Panel    Frequency of Communication with Friends and Family: More than three times a week    Frequency of Social Gatherings with Friends and Family: More than three times a week    Attends Religious Services: More than 4 times per year    Active Member of [employer] or Organizations: Yes    Attends Engineer, structural: More than 4 times per  year    Marital Status: Married  Catering manager Violence: Not At Risk (10/28/2023)   Humiliation, Afraid, Rape, and Kick questionnaire    Fear of Current or Ex-Partner: No    Emotionally Abused: No    Physically Abused: No    Sexually Abused: No     Current Outpatient Medications:    amphetamine -dextroamphetamine  (ADDERALL) 7.5 MG tablet, Take 1 tablet by mouth daily., Disp: 30 tablet, Rfl: 0   meloxicam (MOBIC) 15 MG tablet, Take 15 mg by mouth daily., Disp: , Rfl:      ROS:  Review of Systems  Constitutional:  Negative for fatigue, fever and unexpected weight change.  Respiratory:  Negative for cough, shortness of breath and wheezing.   Cardiovascular:  Negative for chest pain, palpitations and leg swelling.  Gastrointestinal:  Negative for blood in stool, constipation, diarrhea, nausea and vomiting.  Endocrine: Negative for cold intolerance, heat intolerance and polyuria.  Genitourinary:  Negative for dyspareunia, dysuria, flank pain, frequency, genital sores, hematuria, menstrual problem, pelvic pain, urgency, vaginal bleeding, vaginal discharge and vaginal pain.  Musculoskeletal:  Negative for back pain, joint swelling and myalgias.  Skin:  Negative for rash.  Neurological:  Negative for dizziness, syncope, light-headedness, numbness and headaches.  Hematological:  Negative for adenopathy.  Psychiatric/Behavioral:  Negative for agitation, confusion, sleep disturbance and suicidal ideas. The patient is not nervous/anxious.    BREAST: No symptoms    Objective: BP 119/76   Pulse 71   Ht 5' 5 (1.651 m)   Wt 150 lb (68 kg)   LMP 11/11/2023   BMI 24.96 kg/m    Physical Exam Constitutional:      Appearance: She is well-developed.  Genitourinary:     Vulva normal.     Right Labia: No rash, tenderness or lesions.    Left Labia: No tenderness, lesions or rash.    No vaginal discharge, erythema or tenderness.      Right Adnexa: not tender and no mass present.     Left Adnexa: not tender and no mass present.    No cervical friability or polyp.     Uterus is not enlarged or tender.  Breasts:    Right: No mass, nipple discharge, skin change or tenderness.     Left: No mass, nipple discharge, skin change or tenderness.  Neck:     Thyroid: No thyromegaly.  Cardiovascular:     Rate and Rhythm: Normal rate and regular rhythm.     Heart sounds: Normal heart sounds. No murmur heard. Pulmonary:     Effort: Pulmonary effort is normal.     Breath sounds: Normal breath sounds.  Abdominal:     Palpations: Abdomen is soft.     Tenderness: There is no abdominal tenderness. There is no guarding or rebound.  Musculoskeletal:        General: Normal range of motion.     Cervical back: Normal range of motion.  Lymphadenopathy:     Cervical: No cervical adenopathy.  Neurological:     General: No focal deficit present.     Mental Status: She is alert and oriented to person, place, and time.     Cranial Nerves: No cranial nerve deficit.  Skin:    General: Skin is warm and dry.  Psychiatric:        Mood and Affect: Mood normal.        Behavior: Behavior normal.        Thought Content: Thought content normal.        Judgment: Judgment normal.  Vitals reviewed.     Assessment/Plan:  Encounter for annual routine gynecological examination  Cervical cancer screening - Plan: Cytology - PAP  Screening for HPV (human papillomavirus) - Plan: Cytology - PAP  Encounter for screening mammogram for malignant neoplasm of breast - Plan: MM 3D SCREENING MAMMOGRAM BILATERAL BREAST; pt to schedule mammo  Menorrhagia--discussed BC, ablation, lysteda, IUD. Pt to consider and f/u prn.   Screening for colon cancer--pt has colonoscopy appt         GYN counsel breast self exam, mammography screening, menopause, adequate intake of calcium and vitamin D , diet and exercise    F/U  Return in about 1 year (around 11/18/2024).  Junko Ohagan B. Gunther Zawadzki, PA-C 11/19/2023 4:25 PM

## 2023-11-19 NOTE — Patient Instructions (Signed)
 I value your feedback and you entrusting [place] with your care. If you get a [facility] patient survey, I would appreciate you taking the time to let us know about your experience today. Thank you!  [facility] ([place])--[phone]

## 2023-11-25 LAB — CYTOLOGY - PAP
Comment: NEGATIVE
Diagnosis: NEGATIVE
High risk HPV: NEGATIVE

## 2023-12-07 ENCOUNTER — Encounter: Payer: Self-pay | Admitting: Family Medicine

## 2023-12-07 ENCOUNTER — Ambulatory Visit: Admitting: Family Medicine

## 2023-12-07 VITALS — BP 145/97 | HR 77 | Temp 98.2°F | Ht 65.0 in | Wt 150.0 lb

## 2023-12-07 DIAGNOSIS — F901 Attention-deficit hyperactivity disorder, predominantly hyperactive type: Secondary | ICD-10-CM | POA: Insufficient documentation

## 2023-12-07 DIAGNOSIS — E782 Mixed hyperlipidemia: Secondary | ICD-10-CM | POA: Insufficient documentation

## 2023-12-07 MED ORDER — AMPHETAMINE-DEXTROAMPHETAMINE 5 MG PO TABS: 5.0000 mg | ORAL_TABLET | Freq: Every day | ORAL | 0 refills | Status: DC

## 2023-12-07 NOTE — Patient Instructions (Addendum)
 Colonoscopy  [facility] - Dr. Jinny 03/03/24 _________________________________________________  Check vaccine record for hepatitis B series (3 shots) _________________________________________________  Check your blood pressure once daily, and any time you have concerning symptoms like headache, chest pain, dizziness, shortness of breath, or vision changes.   Our goal is less than 130/80.  To appropriately check your blood pressure, make sure you do the following:  1) Avoid caffeine, exercise, or tobacco products for 30 minutes before checking. Empty your bladder. 2) Sit with your back supported in a flat-backed chair. Rest your arm on something flat (arm of the chair, table, etc). 3) Sit still with your feet flat on the floor, resting, for at least 5 minutes.  4) Check your blood pressure. Take 1-2 readings.  5) Write down these readings and bring with you to any provider appointments.  Bring your home blood pressure machine with you to a provider's office for accuracy comparison at least once a year.   Make sure you take your blood pressure medications before you come to any office visit, even if you were asked to fast for labs.

## 2023-12-07 NOTE — Progress Notes (Signed)
 Established patient visit   Patient: Erin Rangel   DOB: [birth year]   48 y.o. Female  MRN: [id number] Visit Date: 12/07/2023  Today's healthcare provider: LAURAINE LOISE BUOY, DO   Chief Complaint  Patient presents with   ADHD    Patient has questions about her medication.  She states she has noticed her heart rate elevated some.    Subjective    HPI Erin Rangel is a 48 year old female who presents with concerns about her current Adderall medication and its side effects.  She experiences side effects from her current Adderall medication, specifically a sensation of 'full throttle' and anxiety. She is on a dose of 7.5 mg, which she finds sometimes too much, especially when active or traveling. She has previously tried halving the dose during travel, which seemed to help manage the intensity of the effects, though it did not fully address her symptoms.  She describes significant thirst and dry mouth, which she attributes to the medication. Additionally, she notes episodes of rapid heartbeats and a sensation of pressure or tightness in her chest, particularly in the mornings after taking the medication. These symptoms are intermittent.  She has a history of feeling exhausted and lacking energy before starting any medication. She mentions a pattern of crashing around 3:30 PM, which she has experienced even before starting Adderall. She has been on Adderall for some time and has noticed these symptoms persist though they have improved.  She is currently taking meloxicam alongside Adderall and questions whether these medications might affect her taste buds, as she has noticed changes in her appetite and taste.  Her social history includes a busy lifestyle with two young children, aged five and nine, which contributes to her daily schedule and stress levels. She mentions a lack of time for physical activity.      Medications: Outpatient Medications Prior to Visit  Medication Sig    meloxicam (MOBIC) 15 MG tablet Take 15 mg by mouth daily.   [DISCONTINUED] amphetamine -dextroamphetamine  (ADDERALL) 7.5 MG tablet Take 1 tablet by mouth daily.   No facility-administered medications prior to visit.        Objective    BP (!) 145/97   Pulse 77   Temp 98.2 F (36.8 C) (Oral)   Ht 5' 5 (1.651 m)   Wt 150 lb (68 kg)   LMP 11/11/2023   SpO2 100%   BMI 24.96 kg/m     Physical Exam Vitals and nursing note reviewed.  Constitutional:      General: She is not in acute distress.    Appearance: Normal appearance.  HENT:     Head: Normocephalic and atraumatic.  Eyes:     General: No scleral icterus.    Conjunctiva/sclera: Conjunctivae normal.  Cardiovascular:     Rate and Rhythm: Normal rate.  Pulmonary:     Effort: Pulmonary effort is normal.  Neurological:     Mental Status: She is alert and oriented to person, place, and time. Mental status is at baseline.  Psychiatric:        Mood and Affect: Mood normal.        Behavior: Behavior normal.      No results found for any visits on 12/07/23.  Assessment & Plan    Attention deficit hyperactivity disorder (ADHD), predominantly hyperactive type -     Amphetamine -Dextroamphetamine ; Take 1 tablet (5 mg total) by mouth daily.  Dispense: 30 tablet; Refill: 0  Moderate mixed hyperlipidemia not requiring statin therapy  Attention deficit hyperactivity disorder Experiences anxiety, tachycardia, dry mouth, and chest pressure on Adderall 7.5 mg. Interested in dose adjustment or switching to Vyvanse. Vyvanse may offer a different side effect profile. Methylphenidate is another option but requires multiple doses. - Switch to Adderall 5 mg immediate release and monitor for side effects. - Consider Vyvanse or methylphenidate if side effects persist. - Monitor blood pressure regularly and bring home cuff to next appointment. - Follow up in four weeks to assess new regimen.  Mixed hyperlipidemia Elevated  cholesterol levels. Dietary modifications suggested. - Encourage dietary modifications to reduce cholesterol intake, such as reducing lunch meat consumption.  General Health Maintenance Due for routine screenings and vaccinations. Discussed importance of hepatitis B vaccine due to liver cancer risk. - Check hepatitis B vaccination records and complete series if needed. - Proceed with scheduled mammogram and colonoscopy. - Encourage regular physical activity, such as walking.    Return in about 4 weeks (around 01/04/2024) for ADHD, BP.      I discussed the assessment and treatment plan with the patient  The patient was provided an opportunity to ask questions and all were answered. The patient agreed with the plan and demonstrated an understanding of the instructions.   The patient was advised to call back or seek an in-person evaluation if the symptoms worsen or if the condition fails to improve as anticipated.    LAURAINE LOISE BUOY, DO  [facility] [phone] (phone) [phone] (fax)  [facility]

## 2023-12-08 ENCOUNTER — Ambulatory Visit
Admission: RE | Admit: 2023-12-08 | Discharge: 2023-12-08 | Disposition: A | Discharge status: Home / Self Care | Source: Ambulatory Visit | Attending: Obstetrics and Gynecology | Admitting: Obstetrics and Gynecology

## 2023-12-08 DIAGNOSIS — Z1231 Encounter for screening mammogram for malignant neoplasm of breast: Secondary | ICD-10-CM | POA: Insufficient documentation

## 2023-12-09 ENCOUNTER — Other Ambulatory Visit: Payer: Self-pay | Admitting: Obstetrics and Gynecology

## 2023-12-09 ENCOUNTER — Encounter: Payer: Self-pay | Admitting: Obstetrics and Gynecology

## 2023-12-09 MED ORDER — MICROGESTIN 24 FE 1-20 MG-MCG PO TABS: 1.0000 | ORAL_TABLET | Freq: Every day | ORAL | 3 refills | Status: AC

## 2023-12-09 NOTE — Progress Notes (Signed)
 Rx OCPs for menorrhagia

## 2023-12-10 ENCOUNTER — Ambulatory Visit: Payer: Self-pay | Admitting: Obstetrics and Gynecology

## 2024-01-04 ENCOUNTER — Encounter: Payer: Self-pay | Admitting: Family Medicine

## 2024-01-04 DIAGNOSIS — F901 Attention-deficit hyperactivity disorder, predominantly hyperactive type: Secondary | ICD-10-CM

## 2024-01-06 ENCOUNTER — Ambulatory Visit: Admitting: Family Medicine

## 2024-01-19 NOTE — Telephone Encounter (Signed)
 Please see the message below.

## 2024-01-24 MED ORDER — AMPHETAMINE-DEXTROAMPHETAMINE 5 MG PO TABS
5.0000 mg | ORAL_TABLET | Freq: Every day | ORAL | 0 refills | Status: AC
Start: 2024-01-24 — End: ?

## 2024-02-16 ENCOUNTER — Ambulatory Visit: Admitting: Family Medicine

## 2024-02-24 ENCOUNTER — Telehealth: Payer: Self-pay

## 2024-02-24 NOTE — Telephone Encounter (Signed)
 Hi Erin Rangel wants to cancel her case, no reschedule at this time due to work conflict. I'll pull her down in the dep[place] for now. thanks  Tue 4:57 PM Good morning Erin Rangel. I will call her. Thanks  6 mins KM Erin Rangel Mars, RN thanks!  3 mins KM Mychart message sent asking her to call back in January to reschedule due to scope schedule is completely full for this year. Thanks,   Attempted to contact by phone.  Voice mail full unable to leave message.  Mychart message sent.  Thanks,  Erin CMA

## 2024-03-03 ENCOUNTER — Ambulatory Visit: Admit: 2024-03-03 | Admitting: Gastroenterology

## 2024-03-03 SURGERY — COLONOSCOPY
Anesthesia: General
